# Patient Record
Sex: Female | Born: 1935 | Race: White | Hispanic: No | Marital: Married | Smoking: Never smoker
Health system: Southern US, Academic
[De-identification: ages and names within clinical notes are randomized; demographics above are authoritative.]

## PROBLEM LIST (undated history)

## (undated) DIAGNOSIS — I10 Essential (primary) hypertension: Secondary | ICD-10-CM

## (undated) DIAGNOSIS — M1990 Unspecified osteoarthritis, unspecified site: Secondary | ICD-10-CM

## (undated) DIAGNOSIS — R12 Heartburn: Secondary | ICD-10-CM

## (undated) DIAGNOSIS — E669 Obesity, unspecified: Secondary | ICD-10-CM

## (undated) DIAGNOSIS — H409 Unspecified glaucoma: Secondary | ICD-10-CM

## (undated) DIAGNOSIS — K635 Polyp of colon: Secondary | ICD-10-CM

## (undated) DIAGNOSIS — I219 Acute myocardial infarction, unspecified: Secondary | ICD-10-CM

## (undated) DIAGNOSIS — K8020 Calculus of gallbladder without cholecystitis without obstruction: Secondary | ICD-10-CM

## (undated) DIAGNOSIS — E119 Type 2 diabetes mellitus without complications: Secondary | ICD-10-CM

## (undated) DIAGNOSIS — H9190 Unspecified hearing loss, unspecified ear: Secondary | ICD-10-CM

## (undated) DIAGNOSIS — E785 Hyperlipidemia, unspecified: Secondary | ICD-10-CM

## (undated) DIAGNOSIS — I2510 Atherosclerotic heart disease of native coronary artery without angina pectoris: Secondary | ICD-10-CM

## (undated) DIAGNOSIS — R5383 Other fatigue: Secondary | ICD-10-CM

## (undated) HISTORY — PX: HX CATARACT REMOVAL: SHX102

## (undated) HISTORY — PX: HX ANKLE FRACTURE TX: SHX122

## (undated) HISTORY — PX: HX TONSILLECTOMY: SHX27

## (undated) HISTORY — PX: HX ADENOIDECTOMY: SHX29

## (undated) HISTORY — PX: HX HYSTERECTOMY: SHX81

## (undated) HISTORY — PX: HX COLECTOMY: SHX59

## (undated) HISTORY — PX: HX CARPAL TUNNEL RELEASE: SHX101

## (undated) HISTORY — DX: Unspecified osteoarthritis, unspecified site: M19.90

## (undated) HISTORY — PX: SMALL INTESTINE SURGERY: SHX150

## (undated) HISTORY — DX: Acute myocardial infarction, unspecified: I21.9

## (undated) HISTORY — PX: OTHER SURGICAL HISTORY: SHX169

## (undated) HISTORY — DX: Unspecified hearing loss, unspecified ear: H91.90

## (undated) HISTORY — DX: Unspecified glaucoma: H40.9

## (undated) HISTORY — PX: ABDOMINAL HYSTERECTOMY: SHX81

## (undated) HISTORY — PX: TONSILLECTOMY: SUR1361

## (undated) HISTORY — DX: Calculus of gallbladder without cholecystitis without obstruction: K80.20

## (undated) HISTORY — DX: Other fatigue: R53.83

## (undated) HISTORY — PX: CATARACT EXTRACTION, BILATERAL: SHX1313

## (undated) HISTORY — PX: ANGIOPLASTY: SHX39

## (undated) HISTORY — PX: COLONOSCOPY: SHX174

## (undated) HISTORY — PX: ANKLE SURGERY: SHX546

## (undated) HISTORY — DX: Atherosclerotic heart disease of native coronary artery without angina pectoris: I25.10

## (undated) HISTORY — PX: VAGINAL PROLAPSE REPAIR: SHX830

---
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
## 1957-07-09 DIAGNOSIS — Z9289 Personal history of other medical treatment: Secondary | ICD-10-CM

## 1957-07-09 HISTORY — DX: Personal history of other medical treatment: Z92.89

## 2010-07-09 DIAGNOSIS — I219 Acute myocardial infarction, unspecified: Secondary | ICD-10-CM

## 2010-07-09 HISTORY — DX: Acute myocardial infarction, unspecified: I21.9

## 2010-08-18 ENCOUNTER — Ambulatory Visit (INDEPENDENT_AMBULATORY_CARE_PROVIDER_SITE_OTHER): Payer: Medicare Other | Admitting: Hospital-Specialty Hospital

## 2010-08-18 ENCOUNTER — Ambulatory Visit
Admission: RE | Admit: 2010-08-18 | Discharge: 2010-08-18 | Disposition: A | Discharge status: Home / Self Care | Payer: Medicare Other | Source: Ambulatory Visit | Attending: Hospital-Specialty Hospital | Admitting: Hospital-Specialty Hospital

## 2010-08-18 VITALS — BP 124/72

## 2010-08-18 DIAGNOSIS — N8110 Cystocele, unspecified: Secondary | ICD-10-CM | POA: Insufficient documentation

## 2010-08-18 DIAGNOSIS — N3946 Mixed incontinence: Secondary | ICD-10-CM | POA: Insufficient documentation

## 2010-08-18 DIAGNOSIS — I10 Essential (primary) hypertension: Secondary | ICD-10-CM | POA: Insufficient documentation

## 2010-08-18 NOTE — Progress Notes (Signed)
Do not send a copy of this letter to the referring provider.   Copy on departmental stationary sent.     August 18, 2010    Silvio Clayman, MD  [address]  [place], [place]    RE: [name]  DOB: 12-23-1918    Dear Dr. Lorin Picket:           Thank you for referring [name]. She is a very pleasant, 77 year old who presented with vaginal prolapse and urinary incontinence.  Speculum examination revealed that her anterior vagina, apex, and upper half of the posterior wall prolapsed past the hymen with Valsalva. Cough stress test is positive in the supine position without bladder filling and with the prolapse reduced. Q-tip test demonstrated a maximum straining angle of 40 degrees.         My impression is that [name] has severe vaginal prolapse and urinary incontinence. I discussed my findings and treatment options in detail with [name]. She feels that her condition is severe enough to warrant surgical correction.  My plan is to schedule [name] for anterior, posterior and enterocele repair; uterosacral ligament vaginal vault suspension, vaginectomy, and placement of tension-free vaginal tape in the near future.        Thank you for asking me to see this very pleasant lady. I will keep you informed of her progress.  If I can be of any further assistance with urogynecologic problems, please do not hesitate to call.       Respectfully,         Liz Beach, MD

## 2010-08-18 NOTE — Progress Notes (Signed)
Do not send a copy of this office note to the referring provider.     [facility] of Medicine   Department of Obstetrics & Gynecology   Division of Urogynecology & Reconstructive Pelvic Surgery     History & Physical     Name: [name]  MRN: [id number]   DOB: 15-May-1919   Date: 08/18/2010     Referring Provider:   America Brown, MD      Ms. Andujo is a 77 y.o. WWF, P8, referred with vaginal prolapse and urinary incontinence, for consultation.     Has a protrusion outside the vaginal introitus for many years.  Gradually increased in size.  Frequently has a 10-cm protrusion outside the vaginal introitus.  Causes pressure, discomfort, and difficulty emptying her bladder.  Has tried several types of pessary without success.    Has occasional urine loss with strenuous activities.   Uses several pads daily for protection.    Voids 8x every day and 2-3x every night.   Has frequent urinary urgency and urge incontinence.   Does not have enuresis.  Does not have to splint to complete a bowel movement.    Not interested in retaining vaginal function.      PMH:  1.  HTN.  2.  Glaucoma.  Able to perform >4 METs with no clinical risk factor.    PSH:  1.  T&A.  2.  Colon resection.  3.  TVH and anterior repair.  4.  Ankle surgery.  5.  Carpal tunnel surgery.    Medications:  1.  HCTZ 12.5 mg qd.  2.  Atenolol 25 mg qd.  3.  ASA 81 mg qd.  4.  Tylenol bid.  5.  Lumigan 0.03% qd.  6.  Dorzolamide 2% tid.    No known medical, latex, iodine, or adhesive tape allergy.    Family History:  No FH of breast, uterine, colon, or ovarian cancer.   No FH of DVT or thrombotic diseases.    Social History:  No hx of cervical dysplasia.   Occ etoh.  Lifetime nonsmoker.      ROS: Review of 12-organ system is noncontributory except as noted above.       PE - healthy female.   BP-124/72, wt-145, ht-64 ins.   Skin has no rash or lesion.   Neck - supple with no thyromegaly.    Abdomen - soft. A low vertical incision is present. There is no mass, tenderness, incisional or inguinal hernia.   Oriented x3.   Appropriate mood and affect.   No CVAT.   Spine and lower extremities have normal ROM.   No palpable supraclavicular or groin nodes.   Speculum - normal external female genitalia.   Thickened mucosa.  Cough stress test is positive in the supine position without bladder filling and with the prolapse reduced..   Q-tip test demonstrated a maximum straining angle of 50 degree.     POPQ: 4,0,9,8,1,1,-9,1,Y/N.    Dipstix of a clean catch urine showed small blood and moderate LE.   A specimen is sent for c&s.       Assessment:   1. Stage V-c vaginal prolapse,   2. Mixed urinary incontinence.  Has higher surgical risk because of her age.    Plan: findings, treatment options, advantages & disadvantages of each discussed in detail.   : Ms. Touchton would like to discuss treatment options with family before making a final decision.  : Follow-up prn.  :  Call with any problem.       Milyn Stapleton HM Benay Pillow, MD

## 2010-08-20 LAB — URINE CULTURE

## 2010-09-01 ENCOUNTER — Encounter (HOSPITAL_COMMUNITY): Payer: Self-pay

## 2010-09-01 ENCOUNTER — Ambulatory Visit: Payer: Medicare Other | Attending: Hospital-Specialty Hospital | Admitting: Hospital-Specialty Hospital

## 2010-09-01 ENCOUNTER — Ambulatory Visit
Admission: RE | Admit: 2010-09-01 | Discharge: 2010-09-01 | Disposition: A | Discharge status: Home / Self Care | Payer: Medicare Other | Source: Ambulatory Visit | Attending: Hospital-Specialty Hospital | Admitting: Hospital-Specialty Hospital

## 2010-09-01 DIAGNOSIS — R32 Unspecified urinary incontinence: Secondary | ICD-10-CM | POA: Insufficient documentation

## 2010-09-01 DIAGNOSIS — N8110 Cystocele, unspecified: Secondary | ICD-10-CM | POA: Insufficient documentation

## 2010-09-01 DIAGNOSIS — Z01818 Encounter for other preprocedural examination: Secondary | ICD-10-CM | POA: Insufficient documentation

## 2010-09-01 DIAGNOSIS — I82409 Acute embolism and thrombosis of unspecified deep veins of unspecified lower extremity: Secondary | ICD-10-CM | POA: Insufficient documentation

## 2010-09-01 DIAGNOSIS — Z7901 Long term (current) use of anticoagulants: Secondary | ICD-10-CM | POA: Insufficient documentation

## 2010-09-01 HISTORY — DX: Essential (primary) hypertension: I10

## 2010-09-01 HISTORY — DX: Unspecified osteoarthritis, unspecified site: M19.90

## 2010-09-01 HISTORY — DX: Polyp of colon: K63.5

## 2010-09-01 HISTORY — DX: Heartburn: R12

## 2010-09-01 HISTORY — DX: Obesity, unspecified: E66.9

## 2010-09-01 LAB — BASIC METABOLIC PANEL
ANION GAP: 11 mmol/L (ref 5–16)
BUN/CREAT RATIO: 22 (ref 6–22)
BUN: 17 mg/dL (ref 6–20)
CALCIUM: 10.3 mg/dL (ref 8.5–10.4)
CARBON DIOXIDE: 27 mmol/L (ref 22–32)
CHLORIDE: 98 mmol/L (ref 96–111)
CREATININE: 0.78 mg/dL (ref 0.49–1.10)
ESTIMATED GLOMERULAR FILTRATION RATE: >59 ml/min/1.73m2 (ref >59)
GLUCOSE,NONFAST: 101 mg/dL (ref 65–139)
POTASSIUM: 3.9 mmol/L (ref 3.5–5.1)
SODIUM: 136 mmol/L (ref 136–145)

## 2010-09-01 LAB — PERFORM POC WHOLE BLOOD GLUCOSE: GLUCOSE, POINT OF CARE: 130 mg/dL — ABNORMAL HIGH (ref 70–105)

## 2010-09-01 LAB — CBC
HCT: 41.7 % (ref 33.5–45.2)
HGB: 15 g/dL (ref 11.5–15.2)
MCH: 32.9 pg (ref 27.4–33.0)
MCHC: 36 g/dL — ABNORMAL HIGH (ref 31.6–35.5)
MCV: 91.4 fL (ref 82.0–99.0)
MPV: 7.6 FL (ref 7.4–10.4)
PLATELET COUNT: 266 THOU/uL (ref 140–450)
RBC: 4.56 MIL/uL (ref 3.84–5.04)
RDW: 11.8 % (ref 10.2–14.0)
WBC: 8.2 THOU/uL (ref 3.5–11.0)

## 2010-09-01 LAB — PT/INR
INR: 1 (ref 0.8–1.2)
PROTHROMBIN TIME: 10.3 s (ref 9.1–11.5)

## 2010-09-01 NOTE — Progress Notes (Signed)
[facility] of Medicine   Department of Obstetrics & Gynecology   Division of Urogynecology & Reconstructive Pelvic Surgery    History & Physical     Name: [name]   MRN: [id number]   DOB: 1918/07/17   Date: 09/01/2010     [name] is a 77 y.o. WWF, P8, with symptomatic vaginal prolapse and urinary incontinence, scheduled for surgical repair.  Not interested in retaining vaginal function.   Has to use a walker recently because of back pain.    PMH:   1. HTN.   2. Glaucoma.   Able to perform >4 METs with no clinical risk factor.     PSH:   1. T&A.   2. Colon resection.   3. TVH and anterior repair.   4. Ankle surgery.   5. Carpal tunnel surgery.     Medications:   1. HCTZ 12.5 mg qd.   2. Atenolol 25 mg qd.   3. ASA 81 mg qd.   4. Tylenol bid.   5. Lumigan 0.03% qd.   6. Dorzolamide 2% tid.     No known medical, latex, iodine, or adhesive tape allergy.    ROS: Lifetime nonsmoker.       PE - healthy female.   BP-140/82, wt-145, ht-64 ins.   Lungs - clear.  CVS - RRR.  Abdomen - soft. A low vertical incision is present. There is no mass, tenderness, incisional or inguinal hernia.   Spine and lower extremities have normal ROM.   Speculum - normal external female genitalia.   Thickened mucosa.   Cough stress test is positive in the supine position without bladder filling and with the prolapse reduced..   Q-tip test demonstrated a maximum straining angle of 50 degree.     POPQ: 4,0,9,8,1,1,-9,1,Y/N.      Assessment: symptomatic vaginal prolapse and stress urinary incontinence.    Plan: findings, treatment options, advantages & disadvantages of each discussed in detail.   : Ms. Charrier feels that her condition is severe enough to warrant surgical correction.  : Anterior, posterior and enterocele repair; uterosacral ligament vaginal vault suspension, placement of TVT, vaginectomy, and cystoscopy.   : The proposed procedures, possible complications, failure rate, and elective nature of procedures explained.  : Avoid ASA and NSAIDs for two weeks prior to surgery.      Leigh Blas HM Benay Pillow, MD

## 2010-09-01 NOTE — Progress Notes (Signed)
[facility]   of Medicine   Department of Obstetrics & Gynecology   Division of Urogynecology & Reconstructive Pelvic Surgery    Pre-operative Risk Assessment     Name: [name]  MRN: [id number]  DOB: [birth year]  Date: 09/01/2010      A. Major cardiac risk factors that require pre-op medical evaluation:     1. Unstable or severe angina. Easily fatiguing with exertion. Achiness in           shoulder, neck, and jaw. Heaviness in left or right arm. No.    2. Decompensated CHF - New York Heart Association class IV. No.      Dyspnea, orthopnea, fatigue, pedal edema.    3. Myocardial infarction less than one month ago. No.    4. Uncontrolled/unstable arrhythmias:   a. High grade atrioventricular block. No.   b. Symptomatic or newly diagnosed ventricular tachycardia such as Mobitz II       or third degree atrioventricular block. No.    c. Supraventricular tachycardia with heart rate >100, at rest. No.    5. Severe/high grade valvular lesions:   a. Severe aortic stenosis with mean valve area<1-cm2, mean pressure          gradient >40 mmHg, or chest pain/congestive heart failure/or light-        headedness. No.  b. Symptomatic mitral stenosis - exertional presyncope, heart failure,          progressive dyspnea on exertion. No.    B. Intermediate risk surgery (cardiac risk 1-5%) - intraperitoneal surgery   1. >4 metabolic equivalents (METs) of activity + one or less medical risk        factor - proceed with surgery.     Definition of clinical risk factors:   a. History of ischemic heart disease.   b. History of compensated or prior CHF.   c. Renal insufficiency (serum creatinine >2 mg/dL).   d. Diabetes.  e. History of cerebral vascular accident.    Definition of poor functional status - cannot perform up to 4 METs:   1. Walking on a flat surface,  2. Climbing flight of stairs,   3. Light housework without chest pain, dyspnea, or fatigue.    Definition of good functional status - >4 METs of activity:   a. Able to climbed one flight of stairs without severe fatigue.   b. Able to walk a block at a brisk pace.   c. Able to run for a short distance.   d. Able to perform heavy work around the house such as mowing       the lawn or scrubbing the floor.   e. Able to participate in sporting events.    C. Electrocardiogram - patients with peripheral vascular disease,       cerebral vascular disease, coronary artery disease, or clinical       risk factors undergoing intermediate risk surgery. No.    D. Pulmonary risk factors.  1. Breathlessness or wheezing @ rest - No.  2. Breathlessness or wheezing w/ <4 METs - No.  3. Breathlessness or wheezing w/ 4 or more METs - No.    E. Family history of deep vein thrombosis - No.     F. Risk level for deep vein thrombosis: High.  1. Low risk: 40 or less and surgery lasting less than 30 minutes.  2. Medium risk: >40, surgery of any duration, and no other clinical risk  factors.  3. High risk: >40 + risk factors.    DVT risk factors:  a. Prior deep vein thrombosis or pulmonary embolus.  b. Varicose vein.  c. Infection.  d. Malignancy.  e. Hormone replacement therapy.  f.  Obesity.  g. Prolonged surgery.    G. Endocarditis prophylaxis: No.    H. Risk for adrenal insufficiency - >20 mg of prednisone or equivalent for >5 days. No.    I. Risk for benzodiazepine withdrawal: No.      Liz Beach, MD

## 2010-09-01 NOTE — Progress Notes (Signed)
[facility] of Medicine   Department of Obstetrics & Gynecology   Division of Urogynecology & Reconstructive Pelvic Surgery    Preoperative Orders    Name: [name]  MRN: [id number]  DOB: 07/18/1918  Date:  09/01/2010    1.  Ancef 2 grams IVSS on call to OR.           2.  CBC, BMP, PT/INR.    3.  EKG.    4.  Lovenox 40-mg sc 2 hours pre-op.        Billey Wojciak HM Benay Pillow, MD

## 2010-09-01 NOTE — Progress Notes (Signed)
[facility] of Medicine   Department of Obstetrics & Gynecology   Division of Urogynecology & Reconstructive Pelvic Surgery     Your surgery is scheduled for March 15th.  Things for [name] to do before her surgery.     1. Do not take the following or similar product after February 27th.  A) Mobic,   B) Diclofenac,   C) Aspirin or Ecotrin,   D) Motrin or ibuprofen,   E) Advil,   F) Aleve,   G) Naproxen,  H) Celebrex.   You may take tylenol.     2. Also, do not take vitamin E, fishoil, any products from [employer] store, or herbal medicine after February 27th.    3. On the day of surgery, please bring all your medications      to the hospital with you no matter what anyone tells you.      Jheremy Boger HM Benay Pillow, MD

## 2010-09-01 NOTE — Anesthesia Preprocedure Evaluation (Addendum)
Additional History Findings:  Patient / Family anesthesia-related difficulties: No  Other: No       Airway   Mallampati: I  TM distance: >3 FB  Neck ROM: full  Mouth Opening: good.  Dental   - negative                       Physical Exam:  Lungs: CTA  Heart: RRR  Other: No    EKG: 09/01/10 done in PAT   CXR: NA  Other Studies:  09/01/10 done in PAT    Consults: Reviewed EKG with Dr. Ethelene Browns, who states she is okay to proceed with surgery.  Follow-Up: None      Anesthesia type: general    ASA 3        Patient's NPO status is appropriate for Anesthesia.      Anesthetic plan and risks discussed with patient.            The Anesthesia Plan above was formulated based on the history, results, and physical exam performed immediately prior to the procedure/surgery.    Patient instructed to take no meds on am of surgery.  Stop aspirin and vitamins for 7 days prior to surgery.

## 2010-09-05 NOTE — Progress Notes (Signed)
[facility] of Medicine   Department of Obstetrics & Gynecology   Division of Urogynecology & Reconstructive Pelvic Surgery    Preoperative Checklist    Name: [name]   MRN: [id number]   DOB: 04-17-1919   Date: 09/05/2010      H&P, labs, and EKG reviewed.  EKG showed LBBB.  Cardiology appt scheduled with [facility] [place].  No contraindication for the planned procedures.      Carolan Avedisian HM Benay Pillow, MD

## 2010-09-13 ENCOUNTER — Encounter (INDEPENDENT_AMBULATORY_CARE_PROVIDER_SITE_OTHER): Payer: Self-pay | Admitting: Interventional Cardiology

## 2010-09-13 ENCOUNTER — Ambulatory Visit (INDEPENDENT_AMBULATORY_CARE_PROVIDER_SITE_OTHER): Payer: Medicare Other | Admitting: Interventional Cardiology

## 2010-09-13 VITALS — BP 138/68 | HR 83 | Resp 16 | Ht 64.0 in | Wt 155.0 lb

## 2010-09-13 DIAGNOSIS — R0602 Shortness of breath: Secondary | ICD-10-CM

## 2010-09-13 HISTORY — DX: Shortness of breath: R06.02

## 2010-09-13 MED ORDER — ATENOLOL 50 MG TABLET
50.0000 mg | ORAL_TABLET | Freq: Every day | ORAL | Status: DC
Start: 2010-09-13 — End: 2011-09-14

## 2010-09-13 NOTE — H&P (Addendum)
History of Present Illness  [name] is a 77 y.o. female who presents with a chief complaint of Establish Care   to clinic with an abnormal preoperative ECG showing LBBB.  She has no known history of coronary artery disease or heart failure with risk factors of HTN, age, and PM status.    She is scheduled for a bladder repair, likely with general anesthesia.  The only cardiac symptom she endorses is SOB "when hurrying".  She can complete light housework such as Pharmacologist and cooking but leaves heavier housework to a helper.  No exertional chest pressure/pain/discomfort.  No edema, palpitations.  Past History  Current outpatient prescriptions   Medication Sig   . hydrochlorothiazide (MICROZIDE) 12.5 mg Oral Capsule take 12.5 mg by mouth Once a day.   Marland Kitchen atenolol (TENORMIN) 25 mg Oral Tablet take 25 mg by mouth Once a day.   . Acetaminophen (TYLENOL) 500 mg Oral Tablet take 500 mg by mouth Once a day.   . Bimatoprost (BIMATOPROST) 0.03 % Ophthalmic Drops by Both Eyes route every night.   . dorzolamide (TRUSOPT) 2 % Ophthalmic Drops 1 Drop by Left Eye route Three times a day.   . Ascorbic Acid (VITAMIN C) 1,000 mg Oral Tablet take 1,000 mg by mouth Once a day.   Marland Kitchen aspirin 81 mg Oral Tablet, Chewable take 81 mg by mouth Once a day.   . multivitamin Oral Tablet take 1 Tab by mouth Once a day.   . CA CARBONATE/VITAMIN D3/VIT K (CALCIUM CHEW ORAL) take  by mouth.   . DISCONTD: ACETAMINOPHEN ORAL take  by mouth.       Allergies   Allergen Reactions   . Phenergan (Promethazine)      Makes her "crazy"       Past Medical History   Diagnosis Date   . PONV (postoperative nausea and vomiting)    . Hypertension    . History of transfusion 1959     no reaction to blood products   . Glaucoma    . Arthritis    . Heartburn      occ. takes TUMS once weekly   . Colon polyp      Hx of   . Obesity    . SOB (shortness of breath) 09/13/2010       Past Surgical History   Procedure Date   . Hx tonsillectomy    . Hx adenoidectomy     . Hx hysterectomy    . Hx carpal tunnel release      right   . Hx colectomy      with polyp removal   . Hx ankle fracture tx      right ankle   . Hx cataract removal      both, eyes       Family History  Family History   Problem Relation Age of Onset   . Stroke Father        Social History  History   Social History   . Marital Status: Married     Spouse Name: N/A     Number of Children: N/A   . Years of Education: N/A   Social History Main Topics   . Smoking status: Never Smoker    . Smokeless tobacco: Not on file   . Alcohol Use: No   . Drug Use: No   . Sexually Active: Not on file   Other Topics Concern   . Ability To Walk 1 Flight Of  Steps Without Sob/cp No     doesn't walk stairs    . Ability To Do Own Adl's Yes     Does own housework and cooking, but does get help with heavier cleaning and heavy housework   . Uses Walker Yes   Social History Narrative   . No narrative on file       Review of Systems  Other than ROS in the HPI, all other review of systems were negative except for: Cardiovascular: positive for dyspnea  Mild dyspnea with exertion, limited by balance and uses cane when leaving home.  No typical exertional angina.   Examination  BP 138/68   Pulse 83   Resp 16   Ht 1.626 m (5\' 4" )   Wt 70.308 kg (155 lb)   BMI 26.61 kg/m2   SpO2 94%  General: appears younger than stated age - accompanied by son  Eyes: Conjunctiva clear., Sclera non-icteric.   HENT:Mouth mucous membranes dry.   Neck: No JVD  Lungs: clear to auscultation bilaterally.   Cardiovascular:    Heart regular rate and rhythm, 1/6 soft systolic murmur heard best at LLSB    Vascular No carotid bruits    Chest wall NormalAbdomen: soft, non-tender  Extremities: no cyanosis or edema  Skin: Skin warm and dry and No rashes  Neurologic: grossly normal  Psychiatric: AOx3 and normal    Diagnosis and Plan  1. LBBB (left bundle branch block) (426.28F)    2. SOB (shortness of breath) (786.05T)    3. HTN (hypertension) (401.9AF)       "New" LBBB, unknown chronicity.  Her preoperative risk assessment deems her a moderate to high cardiac risk for her upcoming bladder surgery based on age, abnormal ECG, symptoms, and high likelihood of underlying CAD.   Discussed risk of not evaluating with preoperative stress test to further evaluate LBBB, but  will have TTE completed asap to evaluate systolic function.   The risk of not having preoperative stress test was discussed at length with the patient and  she accepts the risk of surgery.   Recommend to follow serial cardiac enzymes post operatively.   Recommend to follow serial ECGs although has baseline LBBB.   Recommend she continue her BB in the perioperative period.  HTN BP slightly elevated - Increase atenolol to 50 mg daily.  RTC depending on TTE results.  No orders of the defined types were placed in this encounter.       Placido Sou, PA-C 09/13/2010, 12:37 PM  Barron Schmid, PA-C  Physician Assistant - Certified  [facility]    Ricky Ala, M.D.  Assistant Professor of Medicine  [employer]        I have seen and evaluated the patient jointly with Barron Schmid PA-C and agree with her assessment, recommendations and plans.    Marny Lowenstein, MD         ADDENDUM     The patient had a TTE in [place] yesterday.  I personally read this study.  There was mild (probably ischemic) cardiomyopathy present.  The EF was around 40%.  The septum was akinetic and the apex appeared hypokinetic in some views.  The anterior, lateral, and inferior wall appeared to function normal.  There was mild diastolic dysfunction.  There was no evidence of significant valvular heart disease.  The right ventricular systolic pressure was 35 mmHg which is at the upper limits of normal.  I do not feel that this significantly impacts  the above recommendations.  She would still be considered at moderate to high risk of cardiac complications for a fairly low risk procedure.  She would have a 5-10% chance of developing angina, CHF, or MI and a 2-5% chance of death with a procedure involving general anesthesia.  These risks were discussed with her at length during the clinic visit.  She wishes to proceed with her surgery as scheduled.  She should be monitored in the hospital on telemetry overnight after the procedure and serial ECG and cardiac biomarkers and a repeat TTE should be done if she has hypotension, chest pain, dyspnea, or arrhythmia.  A cardiology consultation should be obtained for any objective evidence of ischemia.  Cardiac catheterization could be pursued when appropriate post-operatively if MI or worsening symptoms develop.        Marny Lowenstein, MD

## 2010-09-14 NOTE — Progress Notes (Addendum)
Addended by: Ricky Ala A on: 09/14/2010      Modules accepted: Level of Service

## 2010-09-15 MED ORDER — CEFAZOLIN 10 GRAM SOLUTION FOR INJECTION
2.00 g | Freq: Once | INTRAMUSCULAR | Status: DC
Start: 2010-09-18 — End: 2010-09-18
  Filled 2010-09-15: qty 20

## 2010-09-15 MED ORDER — DEXTROSE 5 % IN WATER (D5W) INTRAVENOUS SOLUTION
2.00 g | Freq: Once | INTRAVENOUS | Status: DC
Start: 2010-09-18 — End: 2010-09-18
  Filled 2010-09-15: qty 20

## 2010-09-18 ENCOUNTER — Inpatient Hospital Stay (HOSPITAL_COMMUNITY): Payer: Medicare Other

## 2010-09-18 ENCOUNTER — Encounter (HOSPITAL_COMMUNITY)
Admission: RE | Disposition: A | Discharge status: Home / Self Care | Payer: Self-pay | Source: Ambulatory Visit | Attending: Hospital-Specialty Hospital

## 2010-09-18 ENCOUNTER — Inpatient Hospital Stay (HOSPITAL_COMMUNITY): Payer: Medicare Other | Admitting: Anesthesiology-BA

## 2010-09-18 ENCOUNTER — Other Ambulatory Visit (HOSPITAL_COMMUNITY): Payer: Self-pay | Admitting: Hospital-Specialty Hospital

## 2010-09-18 ENCOUNTER — Inpatient Hospital Stay (HOSPITAL_COMMUNITY): Payer: Medicare Other | Admitting: Hospital-Specialty Hospital

## 2010-09-18 ENCOUNTER — Encounter (HOSPITAL_COMMUNITY): Payer: Self-pay

## 2010-09-18 ENCOUNTER — Inpatient Hospital Stay
Admission: RE | Admit: 2010-09-18 | Discharge: 2010-09-21 | DRG: 746 | Disposition: A | Discharge status: Home / Self Care | Payer: Medicare Other | Source: Ambulatory Visit | Attending: Hospital-Specialty Hospital | Admitting: Hospital-Specialty Hospital

## 2010-09-18 ENCOUNTER — Encounter (HOSPITAL_COMMUNITY): Payer: Self-pay | Admitting: NURSE PRACTITIONER

## 2010-09-18 DIAGNOSIS — J9819 Other pulmonary collapse: Secondary | ICD-10-CM | POA: Diagnosis present

## 2010-09-18 DIAGNOSIS — N393 Stress incontinence (female) (male): Secondary | ICD-10-CM | POA: Diagnosis present

## 2010-09-18 DIAGNOSIS — J90 Pleural effusion, not elsewhere classified: Secondary | ICD-10-CM | POA: Diagnosis present

## 2010-09-18 DIAGNOSIS — N815 Vaginal enterocele: Secondary | ICD-10-CM | POA: Diagnosis present

## 2010-09-18 DIAGNOSIS — I2510 Atherosclerotic heart disease of native coronary artery without angina pectoris: Secondary | ICD-10-CM | POA: Diagnosis present

## 2010-09-18 DIAGNOSIS — N993 Prolapse of vaginal vault after hysterectomy: Principal | ICD-10-CM | POA: Diagnosis present

## 2010-09-18 DIAGNOSIS — Y838 Other surgical procedures as the cause of abnormal reaction of the patient, or of later complication, without mention of misadventure at the time of the procedure: Secondary | ICD-10-CM | POA: Diagnosis present

## 2010-09-18 DIAGNOSIS — I519 Heart disease, unspecified: Secondary | ICD-10-CM | POA: Diagnosis present

## 2010-09-18 DIAGNOSIS — I447 Left bundle-branch block, unspecified: Secondary | ICD-10-CM | POA: Diagnosis present

## 2010-09-18 DIAGNOSIS — I214 Non-ST elevation (NSTEMI) myocardial infarction: Secondary | ICD-10-CM | POA: Diagnosis present

## 2010-09-18 DIAGNOSIS — I10 Essential (primary) hypertension: Secondary | ICD-10-CM | POA: Diagnosis present

## 2010-09-18 DIAGNOSIS — I2589 Other forms of chronic ischemic heart disease: Secondary | ICD-10-CM | POA: Diagnosis present

## 2010-09-18 HISTORY — PX: HX BLADDER SUSPENSION: SHX72

## 2010-09-18 LAB — CBC/DIFF
BASOPHILS: 1 % (ref 0–1)
BASOS ABS: 0.078 THOU/uL (ref 0.0–0.2)
EOS ABS: 0.017 THOU/uL — ABNORMAL LOW (ref 0.1–0.3)
EOSINOPHIL: 0 % — ABNORMAL LOW (ref 1–6)
HCT: 32.3 % — ABNORMAL LOW (ref 33.5–45.2)
HGB: 11.5 g/dL (ref 11.5–15.2)
LYMPHS ABS: 0.992 THOU/uL — ABNORMAL LOW (ref 1.0–4.8)
MCH: 32.8 pg (ref 27.4–33.0)
MCHC: 35.7 g/dL — ABNORMAL HIGH (ref 31.6–35.5)
MCV: 91.8 fL (ref 82.0–99.0)
MONOS ABS: 0.662 THOU/uL (ref 0.1–0.9)
MPV: 7.2 FL — ABNORMAL LOW (ref 7.4–10.4)
NRBC'S: 0 /100{WBCs}
PLATELET COUNT: 234 THOU/uL (ref 140–450)
PMN'S: 85 % — ABNORMAL HIGH (ref 40–75)
RBC: 3.52 MIL/uL — ABNORMAL LOW (ref 3.84–5.04)
RDW: 11.5 % (ref 10.2–14.0)
WBC: 12 THOU/uL — ABNORMAL HIGH (ref 3.5–11.0)

## 2010-09-18 LAB — H & H
HCT: 33.1 % — ABNORMAL LOW (ref 33.5–45.2)
HGB: 11.7 g/dL (ref 11.5–15.2)

## 2010-09-18 LAB — TROPONIN-I: TROPONIN-I: <0.01 ng/mL (ref <0.030)

## 2010-09-18 SURGERY — REPAIR ANTERIOR AND POSTERIOR
Anesthesia: General | Wound class: Clean Contaminated Wounds-The respiratory, GI, Genital, or urinary

## 2010-09-18 MED ORDER — GLYCOPYRROLATE 0.2 MG/ML INJECTION SOLUTION
INTRAMUSCULAR | Status: AC
Start: 2010-09-18 — End: 2010-09-18
  Filled 2010-09-18: qty 1

## 2010-09-18 MED ORDER — HYDROMORPHONE (PF) 1 MG/ML INJECTION SOLUTION
0.2000 mg | INTRAMUSCULAR | Status: DC | PRN
Start: 2010-09-18 — End: 2010-09-18

## 2010-09-18 MED ORDER — LIDOCAINE (PF) 100 MG/5 ML (2 %) INTRAVENOUS SYRINGE
INJECTION | INTRAVENOUS | Status: AC
Start: 2010-09-18 — End: 2010-09-18
  Filled 2010-09-18: qty 5

## 2010-09-18 MED ORDER — ENOXAPARIN 40 MG/0.4 ML SUBCUTANEOUS SYRINGE
40.00 mg | INJECTION | SUBCUTANEOUS | Status: DC
Start: 2010-09-19 — End: 2010-09-19
  Filled 2010-09-18: qty 0.4

## 2010-09-18 MED ORDER — DEXTROSE 5 % AND 0.45 % SODIUM CHLORIDE INTRAVENOUS SOLUTION
INTRAVENOUS | Status: DC
Start: 2010-09-18 — End: 2010-09-19

## 2010-09-18 MED ORDER — ATROPINE 0.4 MG/ML INJECTION SOLUTION
INTRAMUSCULAR | Status: AC
Start: 2010-09-18 — End: 2010-09-18
  Filled 2010-09-18: qty 1

## 2010-09-18 MED ORDER — INDIGOTINDISULFONATE SODIUM 8 MG/ML (0.8 %) INJECTION SOLUTION
5.00 mL | Freq: Once | INTRAMUSCULAR | Status: DC | PRN
Start: 2010-09-18 — End: 2010-09-18
  Administered 2010-09-18: 0 mg via INTRAMUSCULAR
  Filled 2010-09-18: qty 5

## 2010-09-18 MED ORDER — GRANISETRON HCL 1 MG/ML (1 ML) INTRAVENOUS SOLUTION
1.0000 mg | Freq: Once | INTRAVENOUS | Status: DC | PRN
Start: 2010-09-18 — End: 2010-09-18

## 2010-09-18 MED ORDER — ENOXAPARIN 40 MG/0.4 ML SUBCUTANEOUS SYRINGE
40.00 mg | INJECTION | Freq: Once | SUBCUTANEOUS | Status: AC
Start: 2010-09-18 — End: 2010-09-18
  Administered 2010-09-18: 40 mg via SUBCUTANEOUS
  Filled 2010-09-18: qty 0.4

## 2010-09-18 MED ORDER — PROPOFOL 10 MG/ML INTRAVENOUS EMULSION
INTRAVENOUS | Status: AC
Start: 2010-09-18 — End: 2010-09-18
  Filled 2010-09-18: qty 20

## 2010-09-18 MED ORDER — HYDROMORPHONE 50 MG/50 ML IN 0.9 % SODIUM CHLORIDE INJECTION
1.00 | INTRAMUSCULAR | Status: DC | PRN
Start: 2010-09-18 — End: 2010-09-19
  Administered 2010-09-18: 1 via INTRAVENOUS

## 2010-09-18 MED ORDER — NITROGLYCERIN 0.4 MG SUBLINGUAL TABLET
0.4000 mg | SUBLINGUAL_TABLET | SUBLINGUAL | Status: DC | PRN
Start: 2010-09-18 — End: 2010-09-21
  Administered 2010-09-19 – 2010-09-20 (×5): 0.4 mg via SUBLINGUAL
  Filled 2010-09-18 (×2): qty 1

## 2010-09-18 MED ORDER — ROCURONIUM 10 MG/ML INTRAVENOUS SOLUTION
INTRAVENOUS | Status: AC
Start: 2010-09-18 — End: 2010-09-18
  Filled 2010-09-18: qty 5

## 2010-09-18 MED ORDER — FENTANYL (PF) 50 MCG/ML INJECTION SOLUTION
INTRAMUSCULAR | Status: AC
Start: 2010-09-18 — End: 2010-09-18
  Filled 2010-09-18: qty 2

## 2010-09-18 MED ORDER — SODIUM CHLORIDE 0.9 % (FLUSH) INJECTION SYRINGE
2.00 mL | INJECTION | INTRAMUSCULAR | Status: DC | PRN
Start: 2010-09-18 — End: 2010-09-18

## 2010-09-18 MED ORDER — SODIUM CHLORIDE 0.9 % (FLUSH) INJECTION SYRINGE
2.00 mL | INJECTION | Freq: Three times a day (TID) | INTRAMUSCULAR | Status: DC
Start: 2010-09-18 — End: 2010-09-18

## 2010-09-18 MED ORDER — DIPHENHYDRAMINE 50 MG/ML INJECTION SOLUTION
12.50 mg | Freq: Four times a day (QID) | INTRAMUSCULAR | Status: DC | PRN
Start: 2010-09-18 — End: 2010-09-19

## 2010-09-18 MED ORDER — HYDROMORPHONE 50 MG/50 ML IN 0.9 % SODIUM CHLORIDE INJECTION
INTRAMUSCULAR | Status: AC
Start: 2010-09-18 — End: 2010-09-18
  Filled 2010-09-18: qty 1

## 2010-09-18 MED ORDER — SODIUM CHLORIDE 0.9 % (FLUSH) INJECTION SYRINGE
2.00 mL | INJECTION | Freq: Three times a day (TID) | INTRAMUSCULAR | Status: DC
Start: 2010-09-18 — End: 2010-09-21
  Administered 2010-09-18: 0 mL
  Administered 2010-09-19 (×2): 2 mL
  Administered 2010-09-19: 0 mL
  Administered 2010-09-20 (×3): 2 mL
  Administered 2010-09-20: 0 mL
  Administered 2010-09-21: 2 mL

## 2010-09-18 MED ORDER — ATENOLOL 25 MG TABLET
25.0000 mg | ORAL_TABLET | Freq: Every day | ORAL | Status: DC
Start: 2010-09-18 — End: 2010-09-21
  Administered 2010-09-18 – 2010-09-21 (×4): 25 mg via ORAL
  Filled 2010-09-18 (×4): qty 1

## 2010-09-18 MED ORDER — SUCCINYLCHOLINE CHLORIDE 20 MG/ML INJECTION SOLUTION
INTRAMUSCULAR | Status: AC
Start: 2010-09-18 — End: 2010-09-18
  Filled 2010-09-18: qty 10

## 2010-09-18 MED ORDER — HYDROMORPHONE (PF) 1 MG/ML INJECTION SOLUTION
INTRAMUSCULAR | Status: AC
Start: 2010-09-18 — End: 2010-09-18
  Filled 2010-09-18: qty 1

## 2010-09-18 MED ORDER — WATER FOR IRRIGATION, STERILE SOLUTION
2000.00 mL | Status: DC | PRN
Start: 2010-09-18 — End: 2010-09-18
  Administered 2010-09-18: 2000 mL
  Filled 2010-09-18: qty 2000

## 2010-09-18 MED ORDER — ONDANSETRON HCL (PF) 4 MG/2 ML INJECTION SOLUTION
INTRAMUSCULAR | Status: AC
Start: 2010-09-18 — End: 2010-09-18
  Filled 2010-09-18: qty 2

## 2010-09-18 MED ORDER — ONDANSETRON HCL (PF) 4 MG/2 ML INJECTION SOLUTION
4.00 mg | Freq: Four times a day (QID) | INTRAMUSCULAR | Status: DC | PRN
Start: 2010-09-18 — End: 2010-09-21
  Administered 2010-09-18 – 2010-09-20 (×3): 4 mg via INTRAVENOUS
  Filled 2010-09-18 (×2): qty 2

## 2010-09-18 MED ORDER — SODIUM CHLORIDE 0.9 % (FLUSH) INJECTION SYRINGE
2.00 mL | INJECTION | INTRAMUSCULAR | Status: DC | PRN
Start: 2010-09-18 — End: 2010-09-21

## 2010-09-18 MED ORDER — EPHEDRINE SULFATE 50 MG/ML INJECTION SOLUTION
INTRAMUSCULAR | Status: AC
Start: 2010-09-18 — End: 2010-09-18
  Filled 2010-09-18: qty 1

## 2010-09-18 MED ORDER — CONJUGATED ESTROGENS 0.625 MG/GRAM VAGINAL CREAM
0.50 g | TOPICAL_CREAM | Freq: Once | VAGINAL | Status: DC | PRN
Start: 2010-09-18 — End: 2010-09-18
  Administered 2010-09-18: 0 g via TOPICAL
  Filled 2010-09-18: qty 42.5

## 2010-09-18 MED ADMIN — sodium chloride 0.9 %, bacteriostatic injection solution: 60 mL | INTRAMUSCULAR | NDC 00409196604

## 2010-09-18 MED ADMIN — sodium chloride 0.9 % irrigation solution: 1000 mL | NDC 00338004804

## 2010-09-18 MED FILL — sodium chloride 0.9 %, bacteriostatic injection solution: 100.0000 mL | INTRAMUSCULAR | Qty: 100 | Status: AC

## 2010-09-18 MED FILL — sodium chloride 0.9 % irrigation solution: 1000.0000 mL | Qty: 1000 | Status: AC

## 2010-09-18 SURGICAL SUPPLY — 61 items
BLADE 15 BD CNV SS SURG TISS STRL LF  DISP (SURGICAL CUTTING SUPPLIES) ×1 IMPLANT
BLADE 15 BD KNF SS SURG TISS S_TRL LF (CUTTING ELEMENTS) ×1
BLANKET 3M BAIR HUG ADLT UPR B ODY 74X24IN PLMR 2 INCS ADH (MISCELLANEOUS PT CARE ITEMS) ×1 IMPLANT
CATH URETH DOVER RBNL 14FR 16IN Ã¡STGR DRAIN EYE RND CLS TIP INT FNL CONN PVC STRL LF  DISP (UROLOGICAL SUPPLIES) ×1 IMPLANT
CATH URETH LBRCTH 16FR FL 2W B_AL LUB SIL DDRGL 5CC STRL LTX (UROLOGICAL SUPPLIES) ×1
CATH URETH LUBRICATH 16FR FOLEY 2W BAL LUB SIL DDRGL 5CC STRL LTX DISP (UROLOGICAL SUPPLIES) ×1 IMPLANT
CONV USE ITEM 156524 - ADHESIVE TISSUE EXOFIN 1.0ML_PREMIERPRO EXOFIN (SEALANTS) ×1 IMPLANT
COUNTER 40 CNT MAG DVN BOXLOCKS STRL LF  BLK SHARP 1840 PLASTIC FOAM XL DISP (NEEDLES & SYRINGE SUPPLIES) ×1 IMPLANT
DCNTR FLUID 9.2IN SET CLAMP STR TUBE N-PYRG DEHP STRL (IV TUBING & ACCESSORIES) ×1 IMPLANT
DEVICE SECURE FL CATH ANCH LON_G WR TIME ADH PVC FM LF (UROLOGICAL SUPPLIES) ×1
DEVICE SECURE FOLEY CATH ANCH LONG WR TIME ADH PVC FOAM LTX (UROLOGICAL SUPPLIES) ×1 IMPLANT
DEVICE TVT 810041B 1/BX (DEVICE) ×1 IMPLANT
DISC USE 162466 - BAG DRAIN UROLOGY 2000ML LF_154003 20EA/CS (UROLOGICAL SUPPLIES) ×1 IMPLANT
DISCONTINUED USE ITEM 319478 - SUTURE 3-0 CT2 COAT VICRYL 27IN VIOL BRD ABS (SUTURE/WOUND CLOSURE) IMPLANT
DONUT EXTREMITY CUSHIONING 31143137 (POSITIONING PRODUCTS) ×3 IMPLANT
DRAPE FL CNTRL PCH DRAIN PORT FILTER SCRN 38X15IN UNDR BUTT CNVRT LF  STRL DISP SURG 27IN (PROTECTIVE PRODUCTS/GARMENTS) ×1 IMPLANT
DRAPE FL CNTRL PCH DRAIN PORT_FILTER SCRN 38X15IN UNDR BUTT (PROTECTIVE PRODUCTS/GARMENTS) ×1
DUPE USE ITEM 319434 - SUTURE 2-0 SH VICRYL 18IN VIOL_CR BRD 8 STRN COAT ABS (SUTURE/WOUND CLOSURE) IMPLANT
DUPE USE ITEM 319452 - SUTURE 0 CT2 VICRYL 27IN VIOL_BRD COAT ABS (SUTURE/WOUND CLOSURE) IMPLANT
ELECTRODE ESURG BLADE PNCL 10FT VLAB STRL SS DISP BUTTON SWH HEX LOCK CORD HLSTR LF  ACPT 3/32IN STD (CAUTERY SUPPLIES) ×1 IMPLANT
HANDLE RIGID PLASTIC STRL LF  DISP DVN EZ HNDL SURG LIGHT (INSTRUMENTS) ×1
HANDPC SUCT MEDIVAC YANKAUER BLBS TIP CLR STRL LF  DISP (Suction) ×1 IMPLANT
HOOK RETRACTR LONESTAR 5MM SS SHARP ELAS STAY 8/PK STRL LF DISP (SUPP) ×1 IMPLANT
HOOK RETRACTR LONESTAR 5MM SS SHARP ELAS STAY STRL LF  DISP (SUPP) ×1
KIT RM TURNOVER CLEANOP CSTM INFCT CONTROL (KITS & TRAYS (DISPOSABLE)) ×1
KIT RM TURNOVER CLEANOP CUSTOM INFCT CONTROL (KITS & TRAYS (DISPOSABLE)) ×1 IMPLANT
MBO USE ITEM 317672 - HANDLE RIGID PLASTIC STRL LF  DISP DVN EZ HNDL SURG LIGHT (SURGICAL INSTRUMENTS) ×1 IMPLANT
NEEDLE HYPO  25GA 1.25IN STD MONOJECT AL SS REG BVL LL HUB UL SHRP ANTICORE RD STRL LF  DISP (NEEDLES & SYRINGE SUPPLIES) ×1 IMPLANT
NEEDLE SPINAL PNK 3.5IN 18GA QUINCKE REG WL POLYPROP QUINCKE TIP STRL LF  DISP (ANETHESIA SUPPLIES) ×1 IMPLANT
NEEDLE SPINAL YW 3.5IN 20GA QUINCKE REG WL POLYPROP STRL LF  DISP (ANETHESIA SUPPLIES) IMPLANT
PACK GYN/PERI 9165 9165 (DRAPE/PACKS/SHEETS/OR TOWEL) ×1 IMPLANT
PACKING VAGINAL 6IN X 2IN 10-026 (WOUND CARE SUPPLY) ×1 IMPLANT
PAD ARMBOARD FOAM BLU_FP-ECARM (POSITIONING PRODUCTS) ×2
PAD ARMBRD BLU (POSITIONING PRODUCTS) ×2 IMPLANT
PEN SURG MRKNG WRITESITE + RLR LBL SET 3X DARKER FORMULATE GNTN VIOL INK STRL LF  CHLRPRP (MISCELLANEOUS PT CARE ITEMS) ×1 IMPLANT
PEN SURG MRKNG WRITESITE + SKN_RLR LBL SET 3X DARKER (MISCELLANEOUS PT CARE ITEMS) ×1
RING RETRACTR LONESTAR NORYL PLASTIC SLF RTN 2 CATH CLIP DISP STRL LF  32.5X18.3CM (SURGICAL INSTRUMENTS) ×1 IMPLANT
RING RETRACTR SM LONESTAR NORYL PLASTIC 2 PEEL PCH SLF RTN FLXB LTWT DISP STRL LF  14.1X14.1CM (SURGICAL INSTRUMENTS) ×1 IMPLANT
SET 81IN REG CLAMP N-PYRG IRRG 10 GTT/ML STR CSCP DEHP BLADDER STRL LF (IV TUBING & ACCESSORIES) ×1 IMPLANT
SOL IV 0.9% NACL 100ML PH 5 PLASTIC CONTAINR PRSV FR VIAFLEX (SOLUTIONS) ×1 IMPLANT
SPONGE LAP 18X4IN STRL (WOUND CARE SUPPLY) ×1 IMPLANT
SPONGE SURG 4X4IN 16 PLY RADOPQ BAND VISTEC STRL LF  BLU WHT (WOUND CARE SUPPLY) ×4 IMPLANT
SUTURE 0 CT2 VICRYL 18IN VIOL CR BRD 8 STRN COAT ABS (SUTURE/WOUND CLOSURE) IMPLANT
SUTURE 0 CT2 VICRYL 18IN VIOL_CR BRD 8 STRN COAT ABS (SUTURE/WOUND CLOSURE)
SUTURE 1 CT1 PDS2 27IN VIOL MONOF ABS (SUTURE/WOUND CLOSURE) IMPLANT
SUTURE 3-0 CT2 COAT VICRYL 27IN VIOL BRD ABS (SUTURE/WOUND CLOSURE)
SUTURE 3-0 SH VICRYL 18IN VIOL CR BRD 8 STRN COAT ABS (SUTURE/WOUND CLOSURE) IMPLANT
SUTURE 3-0 SH VICRYL 27IN VIOL BRD COAT ABS (SUTURE/WOUND CLOSURE) IMPLANT
SUTURE 4-0 PS2 MONOCRYL MTPS 27IN UNDYED MONOF ABS (SUTURE/WOUND CLOSURE) IMPLANT
SUTURE 4-0 PS2 VICRYL MTPS 18IN UNDYED BRD COAT ABS (SUTURE/WOUND CLOSURE) IMPLANT
SUTURE 4-0 SH-1 VICRYL 27IN VIOL BRD COAT ABS (SUTURE/WOUND CLOSURE) IMPLANT
SUTURE CHR 0 CT1 27IN BRN MONOF ABS (SUTURE/WOUND CLOSURE) IMPLANT
SUTURE CHR 3-0 SH 27IN BRN MONOF ABS (SUTURE/WOUND CLOSURE) IMPLANT
SUTURE CHR GUT 3-0 SH 27IN BRN_MONOF ABS (SUTURE/WOUND CLOSURE)
SUTURE J327H VICRYL BX/36 3-0 27IN VIOLET (SUTURE/WOUND CLOSURE) IMPLANT
SYRINGE BULB PLASTIC 60ML CS/50 67000 (BULBS FOR MEDICAL EQUIPMENT) ×1 IMPLANT
SYRINGE LL 10ML LF  STRL CONTROL CONCEN TIP PRGN FREE DEHP-FR MED DISP (NEEDLES & SYRINGE SUPPLIES) ×1 IMPLANT
SYRINGE LL 30ML LF  STRL CONCEN TIP GRAD N-PYRG DEHP-FR MED DISP (NEEDLES & SYRINGE SUPPLIES) ×1 IMPLANT
TRAY SKIN SCRUB 8IN VNYL COTTON 6 WNG 6 SPONGE STICK 2 TIP APPL DRY STRL LF (KITS & TRAYS (DISPOSABLE)) ×1 IMPLANT
TUBING SUCT CLR 20FT 9/32IN MEDIVAC NCDTV M/M CONN STRL LF (Suction) ×1 IMPLANT
WATER STRL 2000ML PLASTIC CONTAINR UROMATIC LF (SOLUTIONS) ×1 IMPLANT

## 2010-09-18 NOTE — OR PostOp (Signed)
Hx and allergies reviewed at bedside with anesthesia.

## 2010-09-18 NOTE — Nurses Notes (Signed)
Pt. Stated she was comfortable, not in any pain. Pt wanted to get out of bed to a chair. Wylie Hail MD paged and stated it was ok to do so. Pt is resting comfortably at the bedside with her feet elevated. Will continue to monitor.

## 2010-09-18 NOTE — OR PreOp (Signed)
Admitted to [address], Area A for OR.  Alert, VSS  Daughter at bedside.

## 2010-09-18 NOTE — Student (Addendum)
[facility]  Pre-Op Note  OB/GYN Surgery    [name]  [birth year]  MRN:  [id number]    Pre-Op Diagnosis:  Vaginal Prolapse and Urinary incontinence     Procedure:  Anterior, posterior, and enterocele repair; Uterosacral ligament vaginal vault suspension, placement of TVT, vaginectomy and cystoscopy.      LABS:      CBC    Lab Results   Component Value Date/Time    WBC 8.2 09/01/10 02:47 PM    HGB 15.0 09/01/10 02:47 PM    HCT 41.7 09/01/10 02:47 PM    PLTCNT 266 09/01/10 02:47 PM    RBC 4.56 09/01/10 02:47 PM    MCV 91.4 09/01/10 02:47 PM    MCHC 36.0* 09/01/10 02:47 PM    MCH 32.9 09/01/10 02:47 PM    RDW 11.8 09/01/10 02:47 PM    MPV 7.6 09/01/10 02:47 PM          Lab Results   Component Value Date    SODIUM 136 09/01/2010    POTASSIUM 3.9 09/01/2010    CHLORIDE 98 09/01/2010    CO2 27 09/01/2010    ANIONGAP 11 09/01/2010    BUN 17 09/01/2010    CREATININE 0.78 09/01/2010    BUNCRRATIO 22 09/01/2010    GFR >59 09/01/2010    GLUCOSENF 101 09/01/2010       CXR:  Not on file  EKG: 09/01/10:  Sinus rhythm, left artrial abnormality, Left bundle branch block     Blood Type:  Not on file     Consent:  Signed and in chart    Orders:    Admit to gyn service   Vital Signs, ht, wt   NPO now   Insert and maintain peripheral IV access.    FluidsIVLR 50  ml/hr   DVT prophylaxis SCDs in OR + LMWH 40 mg subq,  pre-op   Cefazolin 2g, IV for 15 min pre-op

## 2010-09-18 NOTE — Progress Notes (Signed)
 [place]  [facility]                                                      H&P UPDATE FORM    Name: [name]   MRN: [id number]   DOB: 1919-01-21   Date of admission: 09/18/2010  Date: 09/18/2010    STOP: IF H&P IS GREATER THAN 30 DAYS FROM SURGICAL DAY COMPLETE NEW H&P IS REQUIRED.    Outpatient Pre-Surgical H & P updated the day of the procedure.  1.  H&P: done.      Change in medications: no.      Last Menstrual Period: n/a.      Comments: Does not want to retain vaginal function.    2.  Patient continues to be appropiate candidate for planned surgical procedure: yes.    Broderick Fonseca HM Areta, MD

## 2010-09-18 NOTE — OR PostOp (Signed)
 Nausea subsided after zofran  administration.  Daughter at bedside supportive.

## 2010-09-18 NOTE — Nurses Notes (Signed)
Dr. Benay Pillow in to see patient, removed oxygen.  Informed Dr. Benay Pillow that patient's sats were 91% in RA.  Preferred that her sats go no lower than 95%, if so, place back on oxygen.  Instructed to use IS and get up to chair this evening.

## 2010-09-18 NOTE — Progress Notes (Addendum)
 [place]  [facility]  Gynecology     [name],[name], 77 y.o. female  Date of Admission:  09/18/2010  Date of Service: 09/19/2010  Date of Birth:  Mar 24, 1919    Hospital Day:  LOS: 1 day     Subjective: called by RN that pt had and episode of chest pain and dropped sats to 93%. Upon my arrival pt is lying in bed comfortably with nasal cannula delivering 1 L with sats 95%. Pulse 89/min.  She confirms that she woke up with chest tightness like somebody was sitting on her chest suffocating her. She also felt nauseated with it but not SOB. She also recalls radiation to her left sided neck. She confirms that she has had similar episodes on exertion before. Never told anyone.  preop and postop EKG shows LBBB.    Vital Signs:  Temp (24hrs) Max:36.8 [name] (98.2 F)      Systolic (24hrs), Avg:140 mmHg, Min:123 mmHg, Max:164 mmHg    Diastolic (24hrs), Avg:66 mmHg, Min:58 mmHg, Max:85 mmHg    Temp  Avg: 36.5 [name] (97.7 F)  Min: 36.1 [name] (97 F)  Max: 36.8 [name] (98.2 F)  Pulse  Avg: 76.7   Min: 62   Max: 101   Resp  Avg: 19.3   Min: 16   Max: 28   SpO2  Avg: 97 %  Min: 94 %  Max: 98 %  MAP (Non-Invasive)  Avg: 93.5 mmHG  Min: 83 mmHG  Max: 104 mmHG  Pain Score: 3    Current Medications:    Current facility-administered medications:  NS 10 mL injection  2 mL Intracatheter Q8HRS   NS 10 mL injection  2-6 mL Intracatheter Q1 MIN PRN   enoxaparin  (LOVENOX ) 40 mg/0.4 mL SubQ injection  40 mg Subcutaneous Q24H   ondansetron  (ZOFRAN ) 2 mg/mL injection  4 mg Intravenous Q6H PRN   D5W 1/2 NS premix infusion   Intravenous Continuous   HYDROmorphone  (DILAUDID ) 1 mg/mL (tot vol 100 mL) in NS PCA  1 Bag Intravenous Q1H PRN   diphenhydrAMINE  (BENADRYL ) 50 mg/mL injection  12.5 mg Intravenous Q6H PRN   atenolol  (TENORMIN ) tablet  25 mg Oral Daily   nitroglycerin  (NITROSTAT ) sublingual tablet  0.4 mg Sublingual Q5 Min PRN         Today's Physical Exam:  Temperature: 36.8 [name] (98.2 F)  Heart Rate: 85   BP (Non-Invasive): 133/62  mmHg  Respiratory Rate: 18   SpO2-1: 96 %  Pain Score: 3  Gen:NAD  Lungs: clear to auscultation, no wheezes or crackles  CV: no JVD, RRR, S1+S2 no murmurs/rubs/gallops  Abdo: soft, NTTP  Ext: no edema, no tenderness to palpation, venodynes running  Skin: no rash, warm and dry  Foley: clear urine, adequate UO         I/O:  I/O last 24 hours:    Intake/Output Summary (Last 24 hours) at 09/19/10 0016  Last data filed at 09/18/10 2239   Gross per 24 hour   Intake   2870 ml   Output   1950 ml   Net    920 ml       I/O current shift:       Prophylaxis:  Date Started Date Completed   DVT/PE  Lovenox  and SCDs/ Venodynes     GI: Not indicated         Nutrition/Residuals:  Regular    Labs  (Please indicate ordered or reviewed)  Reviewed: Lab Results for Last 24 Hours:  Results for orders placed during the hospital encounter of 09/18/10 (from the past 24 hour(s))   TROPONIN-I   Component Value Range   . TROPONIN-I <0.010  <0.030 (ng/mL)   H & H   Component Value Range   . HGB 11.7  11.5 - 15.2 (g/dL)   . HCT 33.1 (*) 33.5 - 45.2 (%)   BASIC METABOLIC PANEL, NON-FASTING   Component Value Range   . SODIUM 135 (*) 136 - 145 (mmol/L)   . POTASSIUM 4.0  3.5 - 5.1 (mmol/L)   . CHLORIDE 100  96 - 111 (mmol/L)   . CARBON DIOXIDE 25  22 - 32 (mmol/L)   . ANION GAP 10  5 - 16 (mmol/L)   . CREATININE 0.84  0.49 - 1.10 (mg/dL)   . ESTIMATED GLOMERULAR FILTRATION RATE >59  >59 (ml/min/1.63m2)   . GLUCOSE,NONFAST 189 (*) 65 - 139 (mg/dL)   . BUN 13  6 - 20 (mg/dL)   . BUN/CREAT RATIO 15  6 - 22    . CALCIUM  8.7  8.5 - 10.4 (mg/dL)   CBC/DIFF   Component Value Range   . WBC 12.0 (*) 3.5 - 11.0 (THOU/uL)   . RBC 3.52 (*) 3.84 - 5.04 (MIL/uL)   . HGB 11.5  11.5 - 15.2 (g/dL)   . HCT 32.3 (*) 33.5 - 45.2 (%)   . MCV 91.8  82.0 - 99.0 (fL)   . MCH 32.8  27.4 - 33.0 (pg)   . MCHC 35.7 (*) 31.6 - 35.5 (g/dL)   . RDW 11.5  10.2 - 14.0 (%)   . PLATELET COUNT 234  140 - 450 (THOU/uL)   . MPV 7.2 (*) 7.4 - 10.4 (FL)   . PMN'S 85 (*) 40 - 75 (%)   .  PMN ABS 10.300 (*) 1.5 - 7.7 (THOU/uL)   . LYMPHOCYTES 8 (*) 20 - 45 (%)   . LYMPHS ABS 0.992 (*) 1.0 - 4.8 (THOU/uL)   . MONOCYTES 6  4 - 13 (%)   . MONOS ABS 0.662  0.1 - 0.9 (THOU/uL)   . EOSINOPHIL 0 (*) 1 - 6 (%)   . EOS ABS 0.017 (*) 0.1 - 0.3 (THOU/uL)   . BASOPHILS 1  0 - 1 (%)   . BASOS ABS 0.078  0.0 - 0.2 (THOU/uL)   . NRBC'S 0  0 (/100WBC)   MAGNESIUM    Component Value Range   . MAGNESIUM  1.7  1.7 - 2.5 (mg/dL)   PHOSPHORUS   Component Value Range   . PHOSPHORUS 2.8  2.4 - 4.7 (mg/dL)         Assessment/ Plan:   77 yo POD#0 s/p A/P, enterocele repair, TVT, vaginectomy, cysto  Acute chest pain   R/o acute coronary event    EKG- acute changes, ST depression lead I, ST elevations V1-V2  Stat labs including troponin  Stat CXR mobile  telemetry  Spoke with Dr Tonna from medicine - kindly agreed to evaluate the pt  Leonel Fruits, MD 09/17/2009, 11:28 PM    Dr Tonna called back, evaluated the pt and discussed the case with cardiology fellow. Agreed on EKG changes and suspicious history of CAD. But EKG changes difficult to ascertain since LBBB. Recommended serial trop x2 q4hrs and also to notify MD about any acute CP to obtain stat EKG.   Notified Dr Areta, aslo recommended CBC and type &screen next lab draw.  Leonel Fruits, MD 09/19/2010, 12:40 AM

## 2010-09-18 NOTE — OR PostOp (Signed)
[facility]                                                     BRIEF OPERATIVE NOTE    Name: [name]   MRN: [id number]   DOB: 07-11-1918   Date: 09/18/2010    Pre-Operative Diagnosis:   1. Symptomatic vaginal prolapse  2. Stress urinary incontinence.    Post-Operative Diagnosis: same.    Procedure(s)/Description:  1.  Anterior, posterior and enterocele repair.  2.  Placement of TVT.  3.  Vaginectomy.  4.  Cystoscopy.    Findings: see dictated op note.    Attending Surgeon: Liz Beach, MD    Assistant(s): Julian Reil, DO    Anesthesia Type: general endotracheal    Estimated Blood Loss:  600 mL.     Blood Given: none          Fluids Given: crystalloid    Complications:  none    Tubes: foley    Drains: none.           Specimens/ Cultures: vaginal mucosa           Implants: none           Disposition: Op note dictated           Condition: stable      Liz Beach, MD 09/18/2010

## 2010-09-18 NOTE — Anesthesia Postprocedure Evaluation (Signed)
ANESTHESIA POSTOP EVALUATION NOTE        Anesthesia Service      [facility]     09/18/2010      Last Vitals: Temperature: 36.5 C (97.7 F) (09/18/10 1155)  Heart Rate: 82  (09/18/10 1155)  BP (Non-Invasive): 131/60 mmHg (09/18/10 1155)  Respiratory Rate: 20  (09/18/10 1155)  SpO2-1: 97 % (09/18/10 1155)  Weight: 70.8 kg (156 lb 1.4 oz) (09/18/10 0552)  Procedure(s) Performed:  REPAIR ANTERIOR AND POSTERIOR - Lovenox on arrival; SUSPENSION VAGINAL VAULT; REPAIR ENTEROCELE; TAPING TRANSVAGINAL; CYSTOSCOPY  Patient is sufficiently recovered from the effects of anesthesia to participate in the evaluation and has returned to their pre-procedure level.  I have reviewed and evaluated the following:  Respiratory Function: Consistent with pre anesthetic level  Cardiovascular Function: Consistent with pre anesthetic level  Mental Status: Return to pre anesthetic baseline level    Comment/ re-evaluation for any variations: None    Faculty Note:  I examined the patient and agree with the above details of postoperative assessment.

## 2010-09-18 NOTE — OR PostOp (Signed)
 Received report from IVAR Holmes, RN.  Reviewed allergies and history with IVAR Holmes, RN.  No change from previous assessment.  Patient without complaints at present.

## 2010-09-18 NOTE — Progress Notes (Signed)
[facility] [employer] [facility]   [facility]  Department of Obstetrics & Gynecology   Division of Urogynecology & Reconstructive Pelvic Surgery     Inpatient Progress Note    Name: MALERIE EAKINS  MRN: [id number]   DOB: Oct 23, 1918   Date: 09/18/2010     Postoperative Check    S: No complaint. Wants to eat.    O: VS - afebrile and stable.     : Good output.     : Pain score - 0 to 3.      : O2 saturation -  97% on 2 L.     : Abdomen - soft and NT.      : Foley draining clear urine.       Assessment: stable  : BP elevated.    Plan: Routine PO care.  : Start regular diet and atenolol.        Eula Jaster HM Benay Pillow, MD

## 2010-09-18 NOTE — Nurses Notes (Signed)
Received patient to 8W.  RA sats 91%, replaced 2LNC, , sats up to 94%, will monitor.  Family at bedside, no c/o pain.  Assessment completed.

## 2010-09-18 NOTE — Nurses Notes (Signed)
Pt complaining of heaviness in her chest. O2 sat's 93% on RA, put o2 1L NC on and sat's came up to 95% pt felt more comfortable with the o2 on. Wylie Hail MD paged and stated she would be up to see the pt. Will continue to monitor.

## 2010-09-18 NOTE — OR Surgeon (Signed)
[facility]                                  DEPARTMENT OF OBSTETRICS AND GYNECOLOGY                                                OPERATION SUMMARY    PATIENT NAME: [name], [name]  HOSPITAL RUEAVW:[id number]  DATE OF SERVICE: 07/20/2010  DATE OF BIRTH: 1919-05-14    PREOPERATIVE DIAGNOSES:  1.  Symptomatic vaginal prolapse.  2.  Stress urinary incontinence.    POSTOPERATIVE DIAGNOSES: Same.    OPERATIVE PROCEDURES:    1.  Anterior, posterior and enterocele repair.  2.  Placement of tension-free vaginal tape.  3.  Vaginectomy  4.  Cystoscopy.    SURGEON:  Liz Beach, MD     ASSISTANT: Truddie Coco, DO     ESTIMATED BLOOD LOSS:  Approximately 600 mL.    FLUIDS GIVEN:  Crystalloid.    DRAINS:  Foley catheter was placed.    COMPLICATIONS:  None.    INDICATIONS FOR PROCEDURE:  [name] is a 77 year old white female with symptomatic vaginal prolapse and stress urinary incontinence.  Diagnosis, treatment options, advantages and disadvantages of each were explained.  She felt that her condition is severe enough to warrant surgical correction.  The proposed procedure, possible complications, failure rate and the elective nature of the procedure were explained. [name] is not interested in retaining vaginal function.    OPERATIVE FINDINGS:  1.  Her anterior vaginal wall and the upper part of the posterior vagina had prolapsed 8-cm past the hymen.  2.  The vaginal apex had prolapsed 10-cm past the hymen.  3.  The enterocele is distinct from the anterior and posterior vagina.  4.  There was a midline longitudinal tear in the lower portion of the pubocervical fascia.  5.  The superior edge of the pubocervical fascia had torn away from the superior edge of the rectovaginal fascia with a large enterocele.  6. There was a midline longitudinal tear in the upper portion of the rectovaginal fascia.     DESCRIPTION OF PROCEDURE:  Under satisfactory general anesthesia, the patient was placed in candy cane stirrups.  Her knees and hips were checked to avoid overflexion.  Her hips were also checked to avoid overabduction. Both her knees and hips were placed in a 90-degree angle.  Examination under anesthesia was then performed. Findings were described above.    The lower abdomen, perineum and vagina were then prepped and draped in the usual sterile manner.  A [place] retractor was placed for exposure.  The anterior vaginal wall was opened from the apex to an area approximately 1 cm below the external urethral meatus.  Underlying tissue was then dissected away from the vaginal mucosa to the pubic ramus on each side.  Because of the size of the prolapse, an extensive amount of dissection was required.  Numerous bleeding areas were encountered, which were individually coagulated or suture ligated.    The posterior vaginal wall was then opened using an inverted T-shaped incision.  Underlying tissue was then dissected away from the vaginal mucosa to the pelvic sidewall on each side.  Again, an extensive amount of dissection was required.  Bleeding areas were  individually coagulated or suture ligated.  A rectal examination revealed that the superior edge of the pubocervical fascia had torn away from the superior edge of the rectovaginal fascia with a large intervening enterocele.  There was a midline longitudinal tear in the upper portion of the rectovaginal fascia, which was repaired using 5 interrupted stitches of 2-0 Vicryl.    The midline longitudinal tear in the lower portion of the pubocervical fascia was then repaired using 5 interrupted stitches of 2-0 Vicryl.  The enterocele was next repaired by suturing the superior edge of the pubocervical fascia to the superior edge of the rectovaginal fascia using 6 interrupted stitches of 2-0 Vicryl.     A TVT procedure was next performed.  The bladder was first emptied using a red rubber catheter.  Two 1.5-cm suprapubic incisions were made in the suprapubic area.  Each incision was made just above the pubic bone and approximately 1.5 cm lateral to the midline.  The retropubic space on each side was then infiltrated with 30 mL of normal saline through each suprapubic incision.  The TVT tape was first placed on the patient's right side.  A Foley catheter and a guidewire were used to deviate the bladder and the urethra to the patient's left side.  The tip of the TVT trocar was next placed in the right periurethral area just behind the pubic bone and immediately below the right suprapubic incision.  The tip of the TVT trocar was advanced along the posterior pubis until it had penetrated the suprapubic incision.  The Foley catheter and guidewire were removed.  A cystourethroscopy was then performed with a 70-degree cystoscope.  No bladder or urethral injury was noted.  Multiple bladder diverticulum was noted. Urine was noted to flow freely from both ureteral orifices.  The bladder was next drained using a red rubber catheter.  The TVT trocar was disengaged from the handle and pulled up through the suprapubic incision along with a significant portion of the tape.   The TVT tape was next placed on the patient's left side.  A Foley catheter and guidewire were used to deviate the bladder and the urethra to the patient's right side.  The tip of the TVT trocar was next placed in the left periurethral area just behind the pubic bone and immediately below the left suprapubic incision.  The tip of the TVT trocar was advanced along the posterior pubis until it had penetrated the suprapubic incision.  The Foley catheter and guidewire were removed.  A repeat cystourethroscopy was then performed with a 70-degree cystoscope.   No bladder or urethral injury was noted.  The bladder was next drained using a red rubber catheter.  The TVT trocar was disengaged from the handle and pulled up through the suprapubic incision along with a significant portion of the tape.  The TVT tape was pulled up bilaterally until it fit loosely below the urethra with sufficient space to insert a pair of Mayo scissors.  A final cystourethroscopy did not reveal any bladder or urethral injury.  Urine was noted to flow freely from both ureteral orifices.    More than 90% of the anterior vaginal mucosa, which was redundant, was trimmed off.  Approximately 75% of the posterior vaginal mucosa, which was redundant, was trimmed off.  The rest of the mucosa was closed using a running stitch of 3-0 Vicryl.  The perineal body was rebuilt using interrupted stitches of 3-0 Vicryl.  The perineal incision was then closed subcuticularly using a  4-0 Vicryl.  Examination revealed a one-finger introitus.  A rectal examination did not reveal any injury or stitch penetration.  A Foley catheter was placed.  The procedure was then terminated at this point.  Patient was awoken from anesthesia and taken to recovery in good condition.  Estimated blood loss was approximately 600 mL.  Needle, blade and sponge count reported as correct x2.      Liz Beach, MD  Associate Professor  [facility] Department of Obstetrics and Gynecology     YQ/[id number]; D: 09/18/2010 11:47:51; T: 09/18/2010 16:19:11

## 2010-09-18 NOTE — OR PostOp (Signed)
Patient complaint of nausea.  zofran given as per order.  Refer to kardex.

## 2010-09-19 ENCOUNTER — Inpatient Hospital Stay (HOSPITAL_COMMUNITY): Payer: Medicare Other

## 2010-09-19 LAB — CBC
HCT: 30.5 % — ABNORMAL LOW (ref 33.5–45.2)
MCH: 32.6 pg (ref 27.4–33.0)
MCHC: 35.4 g/dL (ref 31.6–35.5)
MCV: 92.2 fL (ref 82.0–99.0)
MPV: 7.3 FL — ABNORMAL LOW (ref 7.4–10.4)
PLATELET COUNT: 236 THOU/uL (ref 140–450)
RBC: 3.31 MIL/uL — ABNORMAL LOW (ref 3.84–5.04)
RDW: 11.4 % (ref 10.2–14.0)
WBC: 11.3 THOU/uL — ABNORMAL HIGH (ref 3.5–11.0)

## 2010-09-19 LAB — BASIC METABOLIC PANEL
ANION GAP: 10 mmol/L (ref 5–16)
BUN/CREAT RATIO: 15 (ref 6–22)
BUN: 13 mg/dL (ref 6–20)
CALCIUM: 8.7 mg/dL (ref 8.5–10.4)
CARBON DIOXIDE: 25 mmol/L (ref 22–32)
CHLORIDE: 100 mmol/L (ref 96–111)
CREATININE: 0.84 mg/dL (ref 0.49–1.10)
ESTIMATED GLOMERULAR FILTRATION RATE: >59 ml/min/1.73m2 (ref >59)
GLUCOSE,NONFAST: 189 mg/dL — ABNORMAL HIGH (ref 65–139)
POTASSIUM: 4 mmol/L (ref 3.5–5.1)
SODIUM: 135 mmol/L — ABNORMAL LOW (ref 136–145)

## 2010-09-19 LAB — CREATINE KINASE (CK), TOTAL, SERUM OR PLASMA
CREATINE KINASE (CK): 133 U/L (ref 24–170)
CREATINE KINASE (CK): 176 U/L — ABNORMAL HIGH (ref 24–170)
CREATINE KINASE (CK): 186 U/L — ABNORMAL HIGH (ref 24–170)
CREATINE KINASE (CK): 191 U/L — ABNORMAL HIGH (ref 24–170)

## 2010-09-19 LAB — CREATINE KINASE (CK), MB FRACTION, SERUM
CK-MB: 11 ng/mL — ABNORMAL HIGH (ref <6.4)
CK-MB: 14.7 ng/mL — ABNORMAL HIGH (ref <6.4)
CK-MB: 11.9 ng/mL — ABNORMAL HIGH (ref <6.4)
MB INDEX: 8.3 % — ABNORMAL HIGH (ref 0.0–5.0)
MB INDEX: 8.4 % — ABNORMAL HIGH (ref 0.0–5.0)
MB INDEX: 8.2 % — ABNORMAL HIGH (ref 0.0–5.0)
MB INDEX: 6.2 % — ABNORMAL HIGH (ref 0.0–5.0)

## 2010-09-19 LAB — PHOSPHORUS: PHOSPHORUS: 2.8 mg/dL (ref 2.4–4.7)

## 2010-09-19 LAB — TROPONIN-I
TROPONIN-I: 0.104 ng/mL (ref <0.030)
TROPONIN-I: 0.402 ng/mL (ref <0.030)
TROPONIN-I: 1.196 ng/mL (ref <0.030)
TROPONIN-I: 1.45 ng/mL (ref <0.030)
TROPONIN-I: 1.547 ng/mL (ref <0.030)

## 2010-09-19 LAB — B-TYPE NATRIURETIC PEPTIDE (BNP),PLASMA: B-TYPE NATRIURETIC PEPTIDE: 141 pg/mL — ABNORMAL HIGH (ref <100)

## 2010-09-19 LAB — MAGNESIUM: MAGNESIUM: 1.7 mg/dL (ref 1.7–2.5)

## 2010-09-19 LAB — HISTORICAL SURGICAL PATHOLOGY SPECIMEN

## 2010-09-19 MED ORDER — OXYCODONE 5 MG CAPSULE
5.0000 mg | ORAL_CAPSULE | ORAL | Status: DC | PRN
Start: 2010-09-19 — End: 2010-09-21
  Administered 2010-09-19 – 2010-09-20 (×4): 5 mg via ORAL
  Filled 2010-09-19 (×4): qty 1

## 2010-09-19 MED ORDER — CALCIUM 200 MG (AS CALCIUM CARBONATE 500 MG) CHEWABLE TABLET
500.0000 mg | CHEWABLE_TABLET | Freq: Every day | ORAL | Status: DC
Start: 2010-09-19 — End: 2010-09-21
  Administered 2010-09-19 – 2010-09-21 (×3): 500 mg via ORAL
  Filled 2010-09-19 (×3): qty 1

## 2010-09-19 MED ORDER — CLOPIDOGREL 75 MG TABLET
75.0000 mg | ORAL_TABLET | Freq: Every day | ORAL | Status: DC
Start: 2010-09-20 — End: 2010-09-21
  Administered 2010-09-20 – 2010-09-21 (×2): 75 mg via ORAL
  Filled 2010-09-19 (×2): qty 1

## 2010-09-19 MED ORDER — ENOXAPARIN 80 MG/0.8 ML SUBCUTANEOUS SYRINGE
70.00 mg | INJECTION | Freq: Two times a day (BID) | SUBCUTANEOUS | Status: DC
Start: 2010-09-19 — End: 2010-09-21
  Administered 2010-09-19 – 2010-09-21 (×4): 70 mg via SUBCUTANEOUS
  Filled 2010-09-19 (×6): qty 0.8

## 2010-09-19 MED ORDER — PERFLUTREN LIPID MICROSPHERES 1.1 MG/ML INTRAVENOUS SUSPENSION
0.30 mL | INTRAVENOUS | Status: AC
Start: 2010-09-19 — End: 2010-09-19
  Administered 2010-09-19: 0.3 mL via INTRAVENOUS

## 2010-09-19 MED ORDER — CLOPIDOGREL 300 MG TABLET
300.0000 mg | ORAL_TABLET | Freq: Once | ORAL | Status: AC
Start: 2010-09-19 — End: 2010-09-19
  Administered 2010-09-19: 0 mg via ORAL
  Filled 2010-09-19: qty 1

## 2010-09-19 MED ORDER — ATORVASTATIN 10 MG TABLET
20.00 mg | ORAL_TABLET | Freq: Every evening | ORAL | Status: DC
Start: 2010-09-19 — End: 2010-09-21
  Administered 2010-09-19 – 2010-09-20 (×2): 20 mg via ORAL
  Filled 2010-09-19 (×3): qty 2

## 2010-09-19 MED ORDER — ASPIRIN 81 MG CHEWABLE TABLET
324.0000 mg | CHEWABLE_TABLET | Freq: Once | ORAL | Status: AC
Start: 2010-09-19 — End: 2010-09-19
  Administered 2010-09-19: 324 mg via ORAL
  Filled 2010-09-19: qty 4

## 2010-09-19 NOTE — Nurses Notes (Signed)
Let DR Wylie Hail know that tropin is now up to 1.547 with no complaints at this time.

## 2010-09-19 NOTE — Care Management Notes (Signed)
Care Coordinator/Social Work Plan  [place]  Patient Name: [name] MRN: [id number] Acct Number: [id number]  DOB: 03/01/1919 Age: 77  **Admission Information**  Patient Type: INPATIENT  Admit Date: 09/18/2010 Admit Time: 05:25  Admit Reason: 1. put as first case.  Admitting Phys: Elspeth Cho HM  Attending Phys: SZE,EDDIE HM  Unit: 8W Bed: 806-A  100. Adult Admission Assessment  Created by : Bari Mantis Date/Time 2010-09-19 12:28:26.130  Patient Assessment & Education  CCC/MSW met with pt/family/other and provided education on On role of the CM Dept in their hospital stay On role of the  Clinical Care Coordinator On role with discharge/transitional planning, How to contact Care Management Department, [facility]  and MSW.   CCC/MSW reviewed with patient/family/other Transportation plan Cindra Eves, daughter  Mental Status:  Mental Status Cooperative   Current mental status close to baseline Yes   Appointed Health Care Decision Maker:  Advanced Directive Completed: Yes- Whom and contact number Dellie Catholic, son  Communications:  Primary Language: English   In what languge does the patient/caregiver want the educational material documented: English   Does the patient/caregiver need a Translator: No   Living Arrangments  Prior to admission housing arrangements: Alone House/Private Residence   Steps: Number of steps to enter ramp Number of steps inside can remain on same level  If the patient is returning to a home does the home have running water: Yes   If the patient is returning to a home does the home have Electricity: Yes   If the patient is returning to a home does the home have Refrigerator (Medications): Yes   If the patient is returning to a home does the home have Heat: Yes   Support System:  Caregiver availble for assistance with post hospitalization? Yes, Name and phone Greig Castilla and his wife  Agencies the patient was using prior to admission: Hospice    DME the patient was using prior to admission Bedside Commode Walker (wheeled) Wheelchair   Comments:  Note: shower chair - Pt husband used [employer] out of [place], [place]. They lived in [place] until he  passed.   Agencies, Pharmacy, Dialysis and PCP Active Prior to Admission:  Does other members of the household have DME or HH in the home? If so, please specify agency No   Does the patient have prescription coverage? Yes- Patent attorney  Patient's Pharmacy Name & Phone Number [facility], [facility]  Primary Care Physician Practice/MD Name & Phone Number Dr. Unice Bailey, [facility]  Functional Status  Prior to admission Ambulation Status-: Independent   Prior to admission Activities of daily living Independent   Current Admission Ambulation Status- Independent   Current Activities of Daily Living Independent   Additional Information Adult:  Literature Provided to the patient None   Case Discussed with:  Case Discussed with Patient Yes   Was the Patient and/or caregiver(s) able to verbalize understanding of the discharge plan? Yes home with son

## 2010-09-19 NOTE — Progress Notes (Addendum)
S: called by RN that she had a second episode of chest pressure. RN gave her [employer] which helped a little. Associated nausea. Vitals stable. sats 97% on 1L.    Upon my arrival pt awake and alert. Says that her chest is still tight but is feeling ok. Denies SOB. Feeling nauseated.    O: BP 138/65   Pulse 97   Temp 36.6 C (97.9 F)   Resp 18   Ht 1.626 m (5\' 4" )   Wt 69.128 kg (152 lb 6.4 oz)   BMI 26.16 kg/m2   SpO2 93%  Lungs: CTABL  CV: RRR, no murmurs, S1+S2  Abdo: soft, non-tender  Legs: no edema, no calf tenderness    A/P  77 yo POD#1 s/p A/P, enterocele repair, TVT, vaginectomy, cysto   Acute chest pain   Probably acute coronary event   EKG- acute changes, ST depression lead I, ST elevations V1-V2   troponin elevated   Continue telemetry   Spoke with Dr Katherine Roan from medicine - recommends to await second troponin at 8am and to start 342 mg chewable ASA stat  D/w Dr Benay Pillow  Also RN notified to let us know about more CP and to place EKG order right away to see changes  Medicine will follow  Samuel Jester, MD 09/19/2010, 6:23 AM

## 2010-09-19 NOTE — Care Management Notes (Signed)
Care Coordinator/Social Work Plan  [place]  Patient Name: [name] MRN: [id number] Acct Number: [id number]  DOB: 1919/02/12 Age: 77  **Admission Information**  Patient Type: INPATIENT  Admit Date: 09/18/2010 Admit Time: 05:25  Admit Reason: 1. put as first case.  Admitting Phys: Elspeth Cho HM  Attending Phys: SZE,EDDIE HM  Unit: 8W Bed: 806-A  130. CM Assessment Notes  Created by : Bari Mantis Date/Time 2010-09-19 12:43:13.207  Assessment Notes  Note:  Note: HD # 1 77 yo POD#1 s/p A/P, enterocele repair, TVT, vaginectomy, cysto with c/o chest pain Clinical: a/o, 1L NC,  pt had episode of chest pain with EKG changes, troponin q4h - last being 0.402, nitro and ASA given, IV Zofran, TTE  ordered, tolerating po, foley, MIVF @ 30cc/hr, ambulatory when stable D/C: pt will be discharged home when medically  stable.   Consult and Reassessment Will continue to re-assess for changes in plan of care or discharge needs

## 2010-09-19 NOTE — Progress Notes (Addendum)
Cardiology Consulted  Carola [name], MD 09/19/2010, 8:58 AM

## 2010-09-19 NOTE — Nurses Notes (Signed)
Pt complained of chest pain again, Dr. Tawni Levy paged. Dr. Katherine Roan stated to give sublingual nitro tab. Gave to pt and it relieved symptoms. Dr. Tawni Levy also put in an order for pt to take own prescription of tums. Will continue to monitor.

## 2010-09-19 NOTE — Consults (Addendum)
[facility]   Cardiology Consult  Initial Consultation      [name],[name], 77 y.o. female  Date of service: 09/19/2010  Date of Birth:  04/07/1919  Requestion Faculty: Dr. Benay Pillow    Information Obtained from: patient, daughter and history reviewed via medical record  Chief Complaint:  Chest pain    Impression/Recommendations:  Patient is a 77 years old female, admitted for a gynecological procedure, who experienced chest discomfort overnight. EKG showed ST changes on V1 and V2, but these are difficult to interpret reliably in the presence of a pre-existing LBBB    -Patient seen and evaluated with Dr. Leo Rod  -Please obtain a chest x-ray, BNP, cardiac enzymes, transthoracic echocardiogram    Addendum (4 PM):  - Pt's troponin are steadily increasing, now at 1.196, awaiting TTE study.  - We shall go ahead and start pt on Lovenox 1 mg/kg BID, Plavix 300 mg tonight followed by 75 mg QD from tomorrow.   - Lipid panel ordered, pt on Atenolol and started on Lipitor.  - We are hoping to avoid using Glycoprotein IIb IIIa inhibitors on this 77 yo female who is having a NSTEMI. Will continue to follow cardiac enzymes. If the pt stays asymptomatic without much further elevation in enzymes, we aim to medically manage her. However, depending on TTE findings and troponin value, we shall plan whether the pt needs a cardiac catheterization in this hospital admission.   - Informed Dr. Benay Pillow of our assessment and plan.    [name], M.D.  Fellow, Section of Cardiology  [facility]      HPI:     Patient is a 77 years old female, admitted for a gynecological procedure, and is currently admitted under the gynecology service. Patient was in her usual state of health post-operatively till around 1030pm last night when she experienced retrosternal chest heaviness while laying in bed, rated as a 4/10, non-radiating, not associated with nausea, vomiting or diaphoresis. Patient reports relief with Dilaudid but was not completely asymptomatic. Patient re-experienced a similar discomfort at around 430 am which was now associated with nausea, and reported relief with nitroglycerin. EKG done showed a LBBB of unknown duration ( but noted on pre-op EKG as well) with ST-T changes which were difficult to interpret in the presence of a LBBB. Serial troponins were 0.010, 0.104 and 0.402. Patient was administered ASA 324mg , and is currently also on Atenolol 25mg  daily ( decreased yesterday from 50mg  daily which she was on for the last 1 week, as recommended by Dr. Leo Rod)    Patient reports having had dizzy spells around 6 years ago, and had Holter placed which was reportedly normal. She was advised a cardiac catheterisation by her cardiologist in [place] but did not follow up with him.         Cardiac Risk Factors:  hypertension    Past Medical History   Diagnosis Date   . PONV (postoperative nausea and vomiting)    . Hypertension    . History of transfusion 1959     no reaction to blood products   . Glaucoma      lt eye   . Arthritis    . Heartburn      occ. takes TUMS once weekly   . Colon polyp      Hx of   . Obesity    . SOB (shortness of breath) 09/13/2010       Past Surgical History   Procedure  Date   . Hx tonsillectomy    . Hx adenoidectomy    . Hx hysterectomy    . Hx carpal tunnel release      right   . Hx colectomy      with polyp removal   . Hx ankle fracture tx      right ankle   . Hx cataract removal      both, eyes   . Hx bladder suspension 09/18/10       Prior to Admission Medications:   Medications Prior to Admission    Medication    atenolol (TENORMIN) 50 mg Oral Tablet    take 1 Tab by mouth Once a day.    hydrochlorothiazide (MICROZIDE) 12.5 mg Oral Capsule    take 12.5 mg by mouth Once a day.    Acetaminophen (TYLENOL) 500 mg Oral Tablet    take 500 mg by mouth Once a day.    Bimatoprost (BIMATOPROST) 0.03 % Ophthalmic Drops    by Both Eyes route every night.    dorzolamide (TRUSOPT) 2 % Ophthalmic Drops    1 Drop by Left Eye route Three times a day.    Ascorbic Acid (VITAMIN [name]) 1,000 mg Oral Tablet    take 1,000 mg by mouth Once a day.    aspirin 81 mg Oral Tablet, Chewable    take 81 mg by mouth Once a day.    multivitamin Oral Tablet    take 1 Tab by mouth Once a day.    CA CARBONATE/VITAMIN D3/VIT K (CALCIUM CHEW ORAL)    take  by mouth.        Inpatient Medications:    Current facility-administered medications:  calcium carbonate (TUMS) chewable tablet  500 mg Oral Daily   oxyCODONE immediate release (OXY IR) capsule  5 mg Oral Q4H PRN   NS 10 mL injection  2 mL Intracatheter Q8HRS   NS 10 mL injection  2-6 mL Intracatheter Q1 MIN PRN   ondansetron (ZOFRAN) 2 mg/mL injection  4 mg Intravenous Q6H PRN   atenolol (TENORMIN) tablet  25 mg Oral Daily   nitroglycerin (NITROSTAT) sublingual tablet  0.4 mg Sublingual Q5 Min PRN       Allergies   Allergen Reactions   . Phenergan (Promethazine)      Makes her "crazy"       Dye Allergy: Not known   Iodine Allergy:  Not known    Family History  Family History   Problem Relation Age of Onset   . Stroke Father          Social History  History   Substance Use Topics   . Smoking status: Never Smoker    . Smokeless tobacco: Never Used   . Alcohol Use: No          ROS:   Constitutional: No night sweats, weight loss or fevers. No recent travel.   Skin: No jaundice. No itching.   Eyes: No double vision or eye pain.   ENT: No tinnitus. No hearing loss.  Cardiac: Chest pain  Respiratory: Dry cough.Shortness of breath on exertion   Endocrine: No polyuria, polydipsia, or polyphagia. No symptoms of thyroid disease  GI: No abdominal pain, or vomiting. Nausea. No diarrhea or constipation. No black tarry stools. Has had no change in the caliber of stools. No heartburn  GU: No nocturia, frequency, urgency, or dysuria. No history of kidney stones.  Musculoskeletal: No myalgias, arthalgias  Neuro: No focal weakness, no loss of  sensation, No changes in speech, gait or coordination. No changes in swallowing. No double vision.       Exam:  Temperature: 36.7 [name] (98.1 F)  Heart Rate: 77   BP (Non-Invasive): 157/67 mmHg  Respiratory Rate: 18   SpO2-1: 93 %  Pain Score: 3  General: appears in good health, appears younger than stated age and no distress  Eyes: Conjunctiva clear.  HENT:Head atraumatic and normocephalic  Neck: No JVD. Left anterior cervical lymphadeonoathy, non-tender  Lungs: Clear to auscultation bilaterally.   Cardiovascular: regular rate and rhythm, S1, S2 normal, I-II/VI systolic murmur over the right 2nd IC space, with no radiation to carotids appreciated. No rub or gallop  Abdomen: Soft, non-tender, Bowel sounds normal, No hepatosplenomegaly  Extremities: No cyanosis or edema  Skin: Skin warm and dry  Neurologic: Grossly normal  Psychiatric: Normal      Labs:  Lab Results for Last 24 Hours:    Results for orders placed during the hospital encounter of 09/18/10 (from the past 24 hour(s))   TROPONIN-I   Component Value Range   . TROPONIN-I <0.010  <0.030 (ng/mL)   H & H   Component Value Range   . HGB 11.7  11.5 - 15.2 (g/dL)   . HCT 33.1 (*) 33.5 - 45.2 (%)   TROPONIN-I   Component Value Range   . TROPONIN-I 0.010  <0.030 (ng/mL)   BASIC METABOLIC PANEL, NON-FASTING   Component Value Range   . SODIUM 135 (*) 136 - 145 (mmol/L)   . POTASSIUM 4.0  3.5 - 5.1 (mmol/L)   . CHLORIDE 100  96 - 111 (mmol/L)   . CARBON DIOXIDE 25  22 - 32 (mmol/L)   . ANION GAP 10  5 - 16 (mmol/L)   . CREATININE 0.84  0.49 - 1.10 (mg/dL)    . ESTIMATED GLOMERULAR FILTRATION RATE >59  >59 (ml/min/1.42m2)   . GLUCOSE,NONFAST 189 (*) 65 - 139 (mg/dL)   . BUN 13  6 - 20 (mg/dL)   . BUN/CREAT RATIO 15  6 - 22    . CALCIUM 8.7  8.5 - 10.4 (mg/dL)   CBC/DIFF   Component Value Range   . WBC 12.0 (*) 3.5 - 11.0 (THOU/uL)   . RBC 3.52 (*) 3.84 - 5.04 (MIL/uL)   . HGB 11.5  11.5 - 15.2 (g/dL)   . HCT 32.3 (*) 33.5 - 45.2 (%)   . MCV 91.8  82.0 - 99.0 (fL)   . MCH 32.8  27.4 - 33.0 (pg)   . MCHC 35.7 (*) 31.6 - 35.5 (g/dL)   . RDW 11.5  10.2 - 14.0 (%)   . PLATELET COUNT 234  140 - 450 (THOU/uL)   . MPV 7.2 (*) 7.4 - 10.4 (FL)   . PMN'S 85 (*) 40 - 75 (%)   . PMN ABS 10.300 (*) 1.5 - 7.7 (THOU/uL)   . LYMPHOCYTES 8 (*) 20 - 45 (%)   . LYMPHS ABS 0.992 (*) 1.0 - 4.8 (THOU/uL)   . MONOCYTES 6  4 - 13 (%)   . MONOS ABS 0.662  0.1 - 0.9 (THOU/uL)   . EOSINOPHIL 0 (*) 1 - 6 (%)   . EOS ABS 0.017 (*) 0.1 - 0.3 (THOU/uL)   . BASOPHILS 1  0 - 1 (%)   . BASOS ABS 0.078  0.0 - 0.2 (THOU/uL)   . NRBC'S 0  0 (/100WBC)   MAGNESIUM   Component Value Range   . MAGNESIUM 1.7  1.7 - 2.5 (mg/dL)   PHOSPHORUS   Component Value Range   . PHOSPHORUS 2.8  2.4 - 4.7 (mg/dL)   CBC   Component Value Range   . WBC 11.3 (*) 3.5 - 11.0 (THOU/uL)   . RBC 3.31 (*) 3.84 - 5.04 (MIL/uL)   . HGB 10.8 (*) 11.5 - 15.2 (g/dL)   . HCT 30.5 (*) 33.5 - 45.2 (%)   . MCV 92.2  82.0 - 99.0 (fL)   . MCH 32.6  27.4 - 33.0 (pg)   . MCHC 35.4  31.6 - 35.5 (g/dL)   . RDW 11.4  10.2 - 14.0 (%)   . PLATELET COUNT 236  140 - 450 (THOU/uL)   . MPV 7.3 (*) 7.4 - 10.4 (FL)   TYPE AND SCREEN   Component Value Range   . UNITS ORDERED NOT STATED         . ABO/RH(D) B POSITIVE     . ANTIBODY SCREEN NEGATIVE     . SPECIMEN EXPIRATION DATE 09/22/2010     TROPONIN-I   Component Value Range   . TROPONIN-I 0.104 (*) <0.030 (ng/mL)   CREATINE KINASE (CK), TOTAL   Component Value Range   . CREATINE KINASE (CK) 133  24 - 170 (U/L)   TROPONIN-I   Component Value Range   . TROPONIN-I 0.402 (*) <0.030 (ng/mL)        Imaging Studies:    Previous echo results:  Done at [place] last week. Report not available, and patient is unsure of report  Previous ECG results:  LBBB, LAA on EKG done on 09/18/2010  Previous cath results:  NA  Previous stress results:  NA        Haze Rushing, MD  PGY-2 Resident,  Department of Medicine,   [facility].          I saw and examined the patient.  I reviewed the resident's note.  I agree with the findings and plan of care as documented in the resident's note.  Any exceptions/additions are edited/noted.    Marny Lowenstein, MD 09/19/2010, 12:08 PM

## 2010-09-19 NOTE — Nurses Notes (Signed)
Service notified of elevated Troponin of 1.45.

## 2010-09-19 NOTE — Nurses Notes (Signed)
Notified service oftroponin of 0.402.  Patientasymptomatic.

## 2010-09-19 NOTE — Progress Notes (Signed)
[facility] [employer] [facility]   [facility]  Department of Obstetrics & Gynecology   Division of Urogynecology & Reconstructive Pelvic Surgery     Inpatient Progress Note    Name: [name]  MRN: [id number]   DOB: Jul 18, 1918   Date: 09/19/2010     POD #1 PM round.    S: No chest pain or pressure this PM.  Minimal spotting.    O: VS - afebrile and stable.     : Excellent output.     : Pain score - 0.     : O2 saturation -  95%.     : Abdomen - soft and NT.     : Foley draining clear urine.     : Portable CXR showed left atelectatics and pleural effusion.     : Serial troponin I today - 0.104 at 4 am, 0.402 at 8am, 1.196 at 12 noon, and 1.450 at 4 pm.     : Transthoracic echo showed a EF of 35%.    Assessment: stable on Lovenox and Plavix.    Plan: check 8 pm troponin I.  : Call cardiology if torponin I level continues to elevate or develop chest pain/pressure again.  : Will continue to monitor on the flor for now.    Sharod Petsch HM Benay Pillow, MD

## 2010-09-19 NOTE — Progress Notes (Signed)
 [place]  [employer]   [facility]  Department of Obstetrics & Gynecology   Division of Urogynecology & Reconstructive Pelvic Surgery     Inpatient Progress Note    Name: [name]  MRN: [id number]   DOB: 01-Aug-1918   Date: 09/19/2010     POD #1    S: Had two episodes of cheat pain and tightness last night.    : Also felt nauseated with one episode.    : Given ASA and nitrostat .    O: VS - afebrile and stable.     : Excellent output.     : Pain score - 0 to 3.     : O2 saturation -  93% on RA.     : Abdomen - soft and NT.      : Foley draining clear urine.     : Hct - 42% with HCTZ pre-op , 33% last night and 31% this am.     : Troponin I was <0.01 last night and MN, and 0.104 at 4 am.    Assessment: stable at present.    : probably need transfusion.    Plan: Repeat tropoini I at 8 am.  : Ambulate.  : Will check Hct after restarted on HCTZ.  : Reassess at noon.      Everlene Cunning HM Areta, MD

## 2010-09-19 NOTE — Consults (Addendum)
[name]  MEDICINE CONSULT    [name], [name], 77 y.o. female  Date of Admission:  09/18/2010  Date of service: 09/19/2010  Date of Birth:  1918/09/07    [name] Day:  LOS: 1 day     Service: Clayton Bibles  Requesting MD: Benay Pillow    Information Obtained from: patient  Chief Complaint:  Chest Discomfort    Assessment/Recommendations: 77 year old female less than 24 hrs post op from gynecologic procedure now with five minute episode of "smothering" chest discomfort that resolved with dilaudid PCA.  Patient has no cardiac history.  Discussed EKG finding with Cardiology Fellow via telephone.  EKG with changes from baseline, but non diagnostic for acute MI(some ST depressions that were present at baseline but are more pronounced at present, and ST elevation in V1 and V2 which is difficult to interpret in face of preexisting LBBB).  Will obtain serial cardiac enzymes.  Please obtain repeat EKG if patient's symptoms return.  Patient is high risk for CAD.  Risk benefit for anticoagulation, should there be biochemical evidence of MI, is unclear in this very old immediate post op patient.      HPI/Discussion:  [name] is a 77 y.o., White female who presents with 5 minute episode of chest discomfort which resolved with dilaudid.  No associated arm or jaw discomfort, nausea, diaphoresis, or SOB.  Nursing says that when they assessed her during the episosde "she did not look good".  She has no history of CAD or other heart malady.  She is treated for hypertension.  She never smoked, and is not diabetic.  She has had this sensation before, also at rest, which has resolved with tums.  She has no anginal symptoms with activity, but does report stable SOB with activity.  She is a high functioning nonagenarian who performs all her ADLs and light housework, but has someone come in to do "the heavy cleaning".  Her daughter is at bedside and says her mom is at baseline.      Past Medical History   Diagnosis Date    . PONV (postoperative nausea and vomiting)    . Hypertension    . History of transfusion 1959     no reaction to blood products   . Glaucoma      lt eye   . Arthritis    . Heartburn      occ. takes TUMS once weekly   . Colon polyp      Hx of   . Obesity    . SOB (shortness of breath) 09/13/2010       Past Surgical History   Procedure Date   . Hx tonsillectomy    . Hx adenoidectomy    . Hx hysterectomy    . Hx carpal tunnel release      right   . Hx colectomy      with polyp removal   . Hx ankle fracture tx      right ankle   . Hx cataract removal      both, eyes   . Hx bladder suspension 09/18/10       Medications Prior to Admission    Medication    atenolol (TENORMIN) 50 mg Oral Tablet    take 1 Tab by mouth Once a day.    hydrochlorothiazide (MICROZIDE) 12.5 mg Oral Capsule    take 12.5 mg by mouth Once a day.    Ascorbic Acid (VITAMIN C) 1,000 mg Oral Tablet  take 1,000 mg by mouth Once a day.    aspirin 81 mg Oral Tablet, Chewable    take 81 mg by mouth Once a day.    multivitamin Oral Tablet    take 1 Tab by mouth Once a day.    CA CARBONATE/VITAMIN D3/VIT K (CALCIUM CHEW ORAL)    take  by mouth.    Acetaminophen (TYLENOL) 500 mg Oral Tablet    take 500 mg by mouth Once a day.    Bimatoprost (BIMATOPROST) 0.03 % Ophthalmic Drops    by Both Eyes route every night.    dorzolamide (TRUSOPT) 2 % Ophthalmic Drops    1 Drop by Left Eye route Three times a day.          Current facility-administered medications:  NS 10 mL injection  2 mL Intracatheter Q8HRS   NS 10 mL injection  2-6 mL Intracatheter Q1 MIN PRN   enoxaparin (LOVENOX) 40 mg/0.4 mL SubQ injection  40 mg Subcutaneous Q24H   ondansetron (ZOFRAN) 2 mg/mL injection  4 mg Intravenous Q6H PRN   D5W 1/2 NS premix infusion   Intravenous Continuous   HYDROmorphone (DILAUDID) 1 mg/mL (tot vol 100 mL) in NS PCA  1 Bag Intravenous Q1H PRN   diphenhydrAMINE (BENADRYL) 50 mg/mL injection  12.5 mg Intravenous Q6H PRN   atenolol (TENORMIN) tablet  25 mg Oral Daily    nitroglycerin (NITROSTAT) sublingual tablet  0.4 mg Sublingual Q5 Min PRN       Allergies   Allergen Reactions   . Phenergan (Promethazine)      Makes her "crazy"       Family History  Family History   Problem Relation Age of Onset   . Stroke Father          Social History  History   Substance Use Topics   . Smoking status: Never Smoker    . Smokeless tobacco: Never Used   . Alcohol Use: No          ROS:   no fever, chill. No n/v/d.  No difficulty with urination.      EXAM:  Temperature: 36.8 C (98.2 F)  Heart Rate: 85   BP (Non-Invasive): 133/62 mmHg  Respiratory Rate: 18   SpO2-1: 96 %  Pain Score: 3  General: appears in good health, appears younger than stated age and comfortable and cheerful, no distress  Eyes: Conjunctiva clear., Pupils equal and round.   HENT:Head atraumatic and normocephalic  Neck: No JVD  Lungs: Clear to auscultation bilaterally.   Cardiovascular: regular rate and rhythm  Abdomen: Soft, non-tender, Bowel sounds normal  Genito-urinary: Deferred  Extremities: trace lower ex edema  Skin: Skin warm and dry  Neurologic: Grossly normal  Lymphatics: No lymphadenopathy  Psychiatric: Normal    Labs:  I have reviewed all lab results.    Imaging Studies:  N/A    [name], MD  PGY3, Internal Medicine  [name]    I reviewed the findings, assessment, and recommendations. Agree with this note. She has been seen for same problem by Cardiologist today and so I did not see patient; therefore, no fee for service today. - Carolann Littler, MD

## 2010-09-19 NOTE — Progress Notes (Addendum)
Troponin increased slightly. Now 1.547 from 1.450, decrease of CK-MB.  Pt vitally stable. Sitting in bed, reading a book. Denies chest heaviness or the nausea feeling she had last night.    Spoke with Cardiology fellow Dr Channing Mutters, who recommended to continue current management and rpt troponin level in am again. Will also closely monitor her vitals and clinical status. Will call cardiology if any acute change.  Samuel Jester, MD 09/19/2010, 10:31 PM

## 2010-09-19 NOTE — Nurses Notes (Signed)
Patient complained of not feeling right.  Patient complains of feeling funny in her chest and nausea.  BP 126/93 P 78 administered 1 Nitro SL @1020  with no relief Asministered 1 Nitro Sl at 1025 BP 123/61 P 75 with relief noted.  Notified Dr. Suzie Portela, Dr. Noe Gens, and Dr. Benay Pillow.  Dr. Benay Pillow to re contact Cardiology service.

## 2010-09-19 NOTE — Care Management Notes (Signed)
Care Coordinator/Social Work Plan  [place]  Patient Name: [name] MRN: [id number] Acct Number: [id number]  DOB: May 28, 1919 Age: 77  **Admission Information**  Patient Type: INPATIENT  Admit Date: 09/18/2010 Admit Time: 05:25  Admit Reason: 1. put as first case.  Admitting Phys: Elspeth Cho HM  Attending Phys: SZE,EDDIE HM  Unit: 8W Bed: 806-A  160. CMS Important Message / Detailed Notice  Created by : Randa Lynn Date/Time 2010-09-19 82:[id number]  Medicare Important Message/Detailed Notice  Admission- CMS-Important Message from Medicare About Your Rights (CMS-1405) IMM was delivered to the patient. A copy was  provided to the patient. (Date / Time) 09-18-10

## 2010-09-20 ENCOUNTER — Encounter (HOSPITAL_COMMUNITY): Payer: Self-pay | Admitting: Internal Medicine

## 2010-09-20 DIAGNOSIS — I214 Non-ST elevation (NSTEMI) myocardial infarction: Secondary | ICD-10-CM

## 2010-09-20 HISTORY — DX: Non-ST elevation (NSTEMI) myocardial infarction (CMS HCC): I21.4

## 2010-09-20 LAB — LIPID PANEL
CHOLESTEROL: 121 mg/dL (ref <200)
HDL-CHOLESTEROL: 27 mg/dL — ABNORMAL LOW (ref >39)
TRIGLYCERIDES: 406 mg/dL — ABNORMAL HIGH (ref <150)

## 2010-09-20 LAB — CREATINE KINASE (CK), TOTAL, SERUM OR PLASMA: CREATINE KINASE (CK): 133 U/L (ref 24–170)

## 2010-09-20 LAB — CBC
HCT: 26.1 % — ABNORMAL LOW (ref 33.5–45.2)
HGB: 9.4 g/dL — ABNORMAL LOW (ref 11.5–15.2)
MCH: 32.9 pg (ref 27.4–33.0)
MCHC: 35.8 g/dL — ABNORMAL HIGH (ref 31.6–35.5)
MCV: 91.8 fL (ref 82.0–99.0)
MPV: 7.2 FL — ABNORMAL LOW (ref 7.4–10.4)
PLATELET COUNT: 198 THOU/uL (ref 140–450)
RBC: 2.85 MIL/uL — ABNORMAL LOW (ref 3.84–5.04)
RDW: 11.3 % (ref 10.2–14.0)
WBC: 8.7 THOU/uL (ref 3.5–11.0)

## 2010-09-20 LAB — H & H
HCT: 29 % — ABNORMAL LOW (ref 33.5–45.2)
HGB: 10.3 g/dL — ABNORMAL LOW (ref 11.5–15.2)

## 2010-09-20 LAB — LDL CHOLESTEROL, DIRECT: LDL CHOLESTEROL,DIRECT: 49 mg/dL (ref <100)

## 2010-09-20 LAB — CREATINE KINASE (CK), MB FRACTION, SERUM
CK-MB: 7 ng/mL — ABNORMAL HIGH (ref <6.4)
CK-MB: 6.1 ng/mL (ref <6.4)
MB INDEX: 4.6 % (ref 0.0–5.0)

## 2010-09-20 LAB — TROPONIN-I
TROPONIN-I: 1.387 ng/mL (ref <0.030)
TROPONIN-I: 1.497 ng/mL (ref <0.030)

## 2010-09-20 MED ORDER — BISACODYL 5 MG TABLET,DELAYED RELEASE
5.0000 mg | DELAYED_RELEASE_TABLET | Freq: Two times a day (BID) | ORAL | Status: DC
Start: 2010-09-20 — End: 2010-09-21
  Administered 2010-09-20 – 2010-09-21 (×3): 5 mg via ORAL
  Filled 2010-09-20 (×4): qty 1

## 2010-09-20 MED ORDER — ISOSORBIDE MONONITRATE ER 60 MG TABLET,EXTENDED RELEASE 24 HR
60.0000 mg | ORAL_TABLET | Freq: Every day | ORAL | Status: DC
Start: 2010-09-20 — End: 2010-09-21
  Administered 2010-09-20 – 2010-09-21 (×2): 60 mg via ORAL
  Filled 2010-09-20 (×2): qty 1

## 2010-09-20 NOTE — Progress Notes (Signed)
 [place]  [employer]   [facility]  Department of Obstetrics & Gynecology   Division of Urogynecology & Reconstructive Pelvic Surgery     Inpatient Progress Note    Name: [name]  MRN: [id number]   DOB: 1918/08/29   Date: 09/20/2010     POD #2    S: Had another episode of cheat tightness, which radiates to her back last night.    : + flatus.  No BM.    : Minimal spotting.    O: VS - afebrile and stable.     : Good output.     : Pain score - 0 to 3.      : O2 saturation -  97%.     : Abdomen - soft and NT. SP incisions - clean and intact.     : Foley draining clear urine.     : Hct form this am - 26%.     : Troponin I from 2000 of 3/13 - 1.54, from 0300 of 3/14 - 1.39, and 0600 of 3/14 - 1.497.    Assessment: clinically stable but continues to have CP.    Plan: Monitor vaginal bleeding.  : Probably need transfusion.  : Ambulate.  : Dulcolax x 2 days.  : Reassess at noon.      Harvey Matlack HM Areta, MD

## 2010-09-20 NOTE — Nurses Notes (Signed)
Patient states pain has eased up some gave second nitro

## 2010-09-20 NOTE — Nurses Notes (Signed)
1 unit of PRBC's completed, pt denies any comlpaints , vs stable.

## 2010-09-20 NOTE — Nurses Notes (Signed)
Patient alert and oriented, resting comfortably in bed at this time- family at bedside. Patient denies any pain. Mady Haagensen was completed by family member. Foley cath removed, waiting on patient to void. Encouraging PO intake at this time. Will monitor. Renada Cronin Ladona Horns, RN 09/20/2010, 8:57 PM

## 2010-09-20 NOTE — Nurses Notes (Signed)
Patient is having indigestion and pain in the upper back felt different than just indigestion gave one nitro.

## 2010-09-20 NOTE — Nurses Notes (Signed)
Notified Dr Tawni Levy will come and see her. B/P 109/53 pulse 78.

## 2010-09-20 NOTE — Nurses Notes (Signed)
Pt started on 1 unit of PRB's at this time, will monitor.

## 2010-09-20 NOTE — Nurses Notes (Signed)
Removed pt's catheter without difficulty.

## 2010-09-20 NOTE — Progress Notes (Signed)
[facility] [employer]   [facility]  Department of Obstetrics & Gynecology   Division of Urogynecology & Reconstructive Pelvic Surgery     Inpatient Progress Note    Name: [name]  MRN: [id number]   DOB: 1918-11-16   Date: 09/20/2010     POD #2    S: No complaint. No BM.    O: VS - afebrile and stable.     : Good output.     : Pain score - 0 to 3.     : O2 saturation - 96%.      : Abdomen - soft and NT. SP incisions - clean and intact.       Assessment: stable    Plan: Check PVR at 2000.  : Reassess in am.      Liz Beach, MD

## 2010-09-20 NOTE — Progress Notes (Addendum)
S: called by RN that pt is having chest pressure. Gave 2 nitro's SL which eased the discomfort. Vitals are stable. B/P 109/53 pulse 78, O2 sat 97% on 1L. Pt reports radiation to her back. Denies SOB or nausea.    O: BP 109/53   Pulse 78   Temp 36.6 C (97.8 F)   Resp 20   Ht 1.626 m (5\' 4" )   Wt 69.128 kg (152 lb 6.4 oz)   BMI 26.16 kg/m2   SpO2 97%  Gen:NAD, lying in bed, looks comfortable  Lungs: clear to auscultation, no wheezes or crackles  CV: RRR, S1+S2 no murmurs/rubs/gallops    A/P  77 yo POD#2 A/P, enterocele repair, TVT, vaginectomy, cysto   Postop NSTEMI   Again onset of CP  Order stat EKG and troponin/CK-MB  Continue telemetry  D/w Dr Channing Mutters from cardio - await labs, continue current management since pt is stable and also receiving treatment  Samuel Jester, MD 09/20/2010, 3:29 AM

## 2010-09-20 NOTE — Consults (Addendum)
[facility]  Cardiology CONSULT  Follow Up Note    [name],[name], 77 y.o. female  Date of Service: 09/20/2010  Date of Birth:  1919/02/23    Hospital Day:  LOS: 2 days     Chief Complaint:  NSTEMI    Impression/Recommendations:  Patient is a 77 years old female, NSTEMI after a gynecological surgery, old LBBB, max troponin 1.547.  - TTE yesterday showed Ischemic cardiomyopathy with no severe valvular disease. Left ventricle systolic function was mildly to moderately reduced. The estimated ejection fraction was likely around 35%. Hypokinesis of the mid-distal anteroseptal myocardium. Probable dyskinesis of the apical myocardium. Mild left ventricular diastolic dysfunction. There has not been a significant worsening of her EF or wall motion abnormalities in this new echo as compared to the preoperative one.  - We would like to continue with medical management. Lovenox 1 mg/kg BID is usually recommended "for duration of hospitalization" per guidelines, (two to five days in most trials).  Continue Plavix 75 mg QD, Atenolol 25 mg qd, Lipitor 20 mg QD. Add Imdur 60 mg QD to her regimen.  Given recent surgery and age and the questionable benefit of ASA in addition to Plavix, we would not recommend ASA therapy for this patient.  - We are going to follow the pt's blood pressure and if she tolerates introduction of Imdur, we can add an ACE-I to her regimen. If we are unable to start an ACE-I during this hospitalization, this will be addressed once she returns to clinic.   - Please have the pt follow with Dr. Leo Rod in 4 weeks after her D/[name] (order placed).   - Will continue to follow. Call with questions, as needed.    Kandy Garrison, M.D.  Fellow, Section of Cardiology  [facility]      Subjective: Pt stable overnight, one episode of chest pain, which resolved with SL nitroglycerin.     Objective:  Temperature: 36.6 [name] (97.9 F)  Heart Rate: 74    BP (Non-Invasive): 149/66 mmHg  Respiratory Rate: 20   SpO2-1: 99 %  Pain Score: 1  General: appears in good health, appears younger than stated age and no distress   Eyes: Conjunctiva clear.   HENT: Head atraumatic and normocephalic   Lungs: Clear to auscultation bilaterally.   Cardiovascular: regular rate and rhythm, S1, S2 normal, I-II/VI systolic murmur over the right 2nd IC space, with no radiation to carotids appreciated.   Extremities: No cyanosis or edema   Skin: Skin warm and dry   Neurologic: Grossly normal     Labs:  Last 3 Cardiac Markers:    Recent Labs   Basename 09/20/10 0600 09/20/10 0315 09/19/10 2000 09/19/10 1600   . TROPONINI 1.497* 1.387* 1.547* --   . CPK 133 -- 191* 186*   . CKMB 6.1 7.0* 11.9* --         Imaging Studies:  CXR:  Stable cardiomegaly, left lower lobe atelectasis and left-sided pleural effusion.    Silver Huguenin, MD 09/20/2010, 9:28 AM      I did not examine this patient directly on this day. The patient's care was discussed with me. I agree with the treatment plan as outlined above.   Marny Lowenstein, MD

## 2010-09-21 LAB — TYPE AND SCREEN
ABO/RH(D): B POS
ANTIBODY SCREEN: NEGATIVE
UNITS ORDERED: 1

## 2010-09-21 MED ORDER — OXYCODONE-ACETAMINOPHEN 5 MG-325 MG TABLET
ORAL_TABLET | ORAL | Status: AC
Start: 2010-09-21 — End: 2010-09-21
  Filled 2010-09-21: qty 1

## 2010-09-21 MED ORDER — OXYCODONE 5 MG CAPSULE
5.00 mg | ORAL_CAPSULE | ORAL | Status: DC | PRN
Start: 2010-09-21 — End: 2010-09-29

## 2010-09-21 MED ORDER — LISINOPRIL 2.5 MG TABLET
2.5000 mg | ORAL_TABLET | Freq: Every day | ORAL | Status: DC
Start: 2010-09-21 — End: 2010-09-21
  Filled 2010-09-21: qty 1

## 2010-09-21 NOTE — Progress Notes (Addendum)
[facility] [employer] [facility]   [facility]  Department of Obstetrics & Gynecology   Division of Urogynecology & Reconstructive Pelvic Surgery     Inpatient Progress Note    Name: [name]  MRN: [id number]   DOB: 10-Sep-1918   Date: 09/21/2010     POD #3    S: Ambulating, voiding, and taking PO well.    : +BM.    O: VS - afebrile and stable.     : Good output.     : Blood sugar -      : Pain score - 0 to 3.     : Abdomen - soft and NT. SP incisions - clean and intact.     : Postop Hct - 29% after one unit of PC.    Assessment: stable  : Hct will probably increase when restart HCTZ.  : No further CP.    Plan: will discharge today after cardiology decides whether to start ASA, ACEI, and her HCTZ.      [name] HM Benay Pillow, MD

## 2010-09-21 NOTE — Nurses Notes (Signed)
Discharge to home with family.  PIV pulled, tele pack removed and given to clerk..  Given discharge instructions and scripts, no questions.  RTN PRN.

## 2010-09-21 NOTE — CDI WORKSHEET (Addendum)
DRG NLV   Working DRG 1: 760 Menstrual & other female reproductive system disorders w CC/MCC  3.3     Final MS-DRG:  746 Vagina, Cervix and Vulva Procedures w CC/MCC     Reason Code: 2-Subsequent Treatment or Documentation    Comments:     Final Coder:  Brett Fairy      Principle Diagnosis:  Vaginal prolapse       Principle Procedure: Procedure Date:   Secondary Dx:    Vag prolapse  NSTEMI  Urinary incontinence  LBBB  CAD   HTN  ICM Secondary Procedures:     Past Medical Hx:     Comments:  3/15 No operative report yet.     Home Medications:

## 2010-09-21 NOTE — Progress Notes (Signed)
[facility] [employer] [facility]   [facility]  Department of Obstetrics & Gynecology   Division of Urogynecology & Reconstructive Pelvic Surgery     Inpatient Progress Note    Name: [name]  MRN: [id number]   DOB: June 04, 1919   Date: 09/21/2010     POD #3    S: Ambulating, voiding, and taking PO well.    : +BM.    : No further CP or pressure.    O: VS - afebrile and stable.     : Good output.     : Pain score - 0 to 3.     : O2 saturation - 97%.      : Abdomen - soft and NT. SP incisions - clean and intact.    Assessment: stable.  : Postop MI.  : HTN.  : Glaucoma.    Plan: Discharge home.  : Regular diet.  : Modify activity.  : Follow-up 6 weeks.  : Call with any problems.  : Printed discharge instructions given and reviewed.  : Oxycodone 5-mg, #20, given x pain.  : Atenolol 25 mg qd, Imdur 60 mg qd, Plavix 75 mg qd, Lisinopril 2.5 mg qd, Lipitor 20 mg qd.  : Follow-up Dr. Leo Rod in 4 weeks.    Alyrica Thurow HM Benay Pillow, MD

## 2010-09-21 NOTE — Consults (Addendum)
[facility]  Quality Care Measures-[facility]      Per discussion with  Haze Rushing, MD :  Patient unable to be given ACE I/ ARB for EF    35%   related to  low blood pressure .  No ASA at this time related to concern for low HGB and HCT (monitoring).      Pt was to be started on ACE-I prior to discharge.  Please see progress notes for rationale behind not using ASA in this patient.        Marny Lowenstein, MD

## 2010-09-21 NOTE — CDI REVIEW (Signed)
Admissions, Findings, and Consultations:  3/12 GYN--vaginal prolapse urinary incontinence.    3/12 H&P update form.  3/7 H&P--same  3/12 GYN--post op check  3/12 GYN--cp with radiation to left arm.  EKG shows LBBB.  Acute EKG changes.  With suspicious h/o CAD.   Med. Consult.  3/13 Med--obesity  3/13 GYN--chest pain again.   Probably acute coronary event.   3/13 GYN--reassess at noon.   3/13 Cards--consult.   3/13 Cards--NSTEMI.   3/13 GYN--cont. Poc.  3/13 GYN--increasing troponins  3/14 GYN--post op NSTEMI  3/14 GYN--cont to monitor.  3/14 Cards--ICM.  Continue with medical management.  Stable cardiomegaly.  Left sided pleural effusion.  3/14 GYN--no c/o.  3/15 GYN--may d/c today.  No further CP       Diagnostics and Results:   Procedures and Treatments:   Meds, IV's, Rx Blood:   Comments:

## 2010-09-21 NOTE — Consults (Addendum)
[facility]  Cardiology CONSULT  Follow Up Note    Marsicano,Anysia C, 77 y.o. female  Date of Service: 09/21/2010  Date of Birth:  [birth year]    Hospital Day:  LOS: 3 days     Chief Complaint:  NSTEMI    Impression/Recommendations:  Patient is a 77 years old female, NSTEMI after a gynecological surgery, old LBBB, max troponin 1.547.  - Pt stable overnight. At discharge, please continue Plavix 75 mg QD, Atenolol 25 mg qd, Lipitor 20 mg QD, Imdur 60 mg QD. Since her BP has remained stable, we shall add Lisinopril 2.5 mg QD to her regimen for her low LVEF (continue at D/C).   - We are going to hold off on re-starting her Aspirin (see previous note). At this stage, we prefer to start her on Plavix first.   - Please have the pt follow with Dr. Leo Rod in 4 weeks after her D/C (order placed).   - Call with questions, as needed.    Kandy Garrison, M.D.   Fellow, Section of Cardiology   [facility]    Subjective: Pt stable overnight, no more episodes of chest pain, s/p transfusion of 1 U PRBC, last Hb > 10.3.    Objective:  Temperature: 37.1 C (98.8 F)  Heart Rate: 69   BP (Non-Invasive): 116/58 mmHg  Respiratory Rate: 18   SpO2-1: 94 %  Pain Score: 2  General: appears in good health, appears younger than stated age and no distress   Eyes: Conjunctiva clear.   HENT: Head atraumatic and normocephalic   Lungs: Clear to auscultation bilaterally.   Cardiovascular: regular rate and rhythm, S1, S2 normal, I-II/VI systolic murmur over the right 2nd IC space, with no radiation to carotids appreciated.   Extremities: No cyanosis or edema   Skin: Skin warm and dry   Neurologic: Grossly normal     Labs and Imaging Studies:  reviewed    Silver Huguenin, MD 09/21/2010, 12:52 PM      I did not examine this patient directly. The patient's care was discussed with me. I agree with the treatment plan as outlined above.   Marny Lowenstein, MD

## 2010-09-22 NOTE — Discharge Summary (Signed)
[facility]                                DEPARTMENT OF OBSTETRICS AND GYNECOLOGY                                              DISCHARGE SUMMARY    PATIENT NAME: [name], [name]  HOSPITAL [id number]  DATE OF BIRTH: [birth year]    ADMISSION DATE: 09/18/2010  DISCHARGE DATE: 09/21/2010    PRINCIPAL DIAGNOSES:  1. Symptomatic vaginal prolapse.  2. Stress urinary incontinence.  3. NSTMI.     OPERATIVE PROCEDURES:    1. Anterior, posterior, and enterocele repair.  2. Placement of tension-free vaginal tape.  3. Vaginectomy.  4. Cystoscopy.     Ms. [name] is a 77 year old WMF with symptomatic vaginal prolapse and stress urinary incontinence who was admitted for surgical repair.  A preoperative cardiac evaluation was ordered because her EKG showed LBBB. Her echocardiogram showed a EF of ~35%.  Postoperatively, her EKG in the recovery room was normal.  On the evening of her surgery, [name] complained of chest tightness and pain.  Her troponin was elevated.  A cardiac consult was requested. Serial levels showed that her troponin level reached a maximum level of 1.547.  A chest x-ray showed acute atelectasis and left pleural effusion.  Her postop Hct was approximately 30%.  An echocardiogram showed ejection fraction of approximately 35%, which was unchanged, and a normal functioning left ventricle.  [name] was diagnosed to have NSTMI.  She was started on Lovenox and Plavix.  [name] experienced another episode of chest tightness and pain on the evening of POD #1.  She was started on Imdur. Her condition subsequently stabilized and was discharged home on postoperative day #3.  At that time she was ambulating, voiding, and taking po well.  Her bowel function has also returned to normal.  Examination showed the patient to be afebrile throughout her hospital course with stable vital signs, excellent urine output.  Her abdomen was soft and nontender.  The suprapubic incisions were clean and intact.  A repeat hematocrit was 26%. [name] was given one unit of packed cells.    DISCHARGE INSTRUCTIONS:     1.   Regular diet.  2.   Modified activity.  3.   Follow-up in 6 weeks.  4.   Follow-up with Dr. Christell Constant in [place] in 4 weeks.  5.   Printed discharge instructions were given and reviewed.    6.   Oxycodone 5 mg, #20, was given for pain.    7.   Atenolol 25 mg daily.  8.   Imdur 60 mg daily.  9.   Plavix 75 mg daily.  10. Lisinopril 2.5 mg daily.  11. Lipitor 25 mg daily.  12. Call with any problems.    Liz Beach, MD  Associate Professor   [facility] Department of Obstetrics and Gynecology    [id number]; D: 09/21/2010 15:03:10; T: 09/22/2010 07:12:09

## 2010-09-24 ENCOUNTER — Telehealth (INDEPENDENT_AMBULATORY_CARE_PROVIDER_SITE_OTHER): Payer: Self-pay | Admitting: Obstetrics & Gynecology

## 2010-09-24 NOTE — Telephone Encounter (Addendum)
Called Ms. Cizek.  Recommended using Dulcolax supp and repeat in 4 hours as needed.  To take one MOM pill at bedtime prn if did not have a BM that day.    Tearsa Kowalewski HM Benay Pillow, MD

## 2010-09-24 NOTE — Telephone Encounter (Signed)
[name]  Mar 13, 1919  [id number]    09/24/2010  0818    Received call from MARS line from patient's daughter.  States patient has not bad BM since Thursday and is having some cramping and nausea.  Patient had anterior/posterior repair and TVT surgery on 09/18/10  with Dr. Benay Pillow with a post-operative NSTEMI.    Contacted Dr. Benay Pillow and let him know of patient's complaints.  Dr. Benay Pillow will call patient and daughter and speak with them.  Phone #[phone].    Truddie Coco, MD 09/24/2010, 10:40 AM

## 2010-09-25 ENCOUNTER — Other Ambulatory Visit (HOSPITAL_BASED_OUTPATIENT_CLINIC_OR_DEPARTMENT_OTHER): Payer: Self-pay | Admitting: Interventional Cardiology

## 2010-09-25 MED ORDER — NITROGLYCERIN 0.4 MG SUBLINGUAL TABLET
0.40 mg | SUBLINGUAL_TABLET | SUBLINGUAL | Status: DC | PRN
Start: 2010-09-25 — End: 2011-10-24

## 2010-09-26 ENCOUNTER — Encounter (HOSPITAL_BASED_OUTPATIENT_CLINIC_OR_DEPARTMENT_OTHER): Payer: Self-pay | Admitting: Hospital-Specialty Hospital

## 2010-09-26 NOTE — Progress Notes (Signed)
[facility] Medicine   Department of Obstetrics & Gynecology   Division of Urogynecology & Reconstructive Pelvic Surgery     Outpatient Progress Note    Name: [name]  MRN: [id number]   DOB: 14-Oct-1918   Date: 09/26/2010     Called [name] for follow-up.  Had numbness in both legs this morning.  Also has intermittent nausea.  Normal bowel movements.    Recommend take BP tid x 3 days to check for hypotension.  Talk to pharmacist regarding whether her new medications side effects and possible ways to prevent them.  Call with problems.      Yao Hyppolite HM Benay Pillow, MD

## 2010-09-28 ENCOUNTER — Encounter (HOSPITAL_BASED_OUTPATIENT_CLINIC_OR_DEPARTMENT_OTHER): Payer: Self-pay | Admitting: Hospital-Specialty Hospital

## 2010-09-28 NOTE — Progress Notes (Signed)
 [place]  [employer] [facility]   Department of Obstetrics & Gynecology   Division of Urogynecology & Reconstructive Pelvic Surgery     Outpatient Progress Note    Name: DERETHA ERTLE  MRN: [id number]   DOB: 11-Dec-1918   Date: 09/28/2010     Called Ms. Maciejewski for follow-up.  Voiding without difficulty.  Her bowel function is very normal.    Still has nausea intermittently and has funny feeling in her chest at night.  Has not been sleeping well.  Feels better to sit up at night.  Has intermittent nausea feeling before the surgery.    Her appt with Dr. Delinda is in April 14.      Her BP - 115/65 and 119/72 on 3/21.  170/94, 135/85, 150/90 on 3/22.  160/84 this am.    Recommend to call Dr. Delinda 's office for follow-up asap.      Lisett Dirusso HM Areta, MD

## 2010-09-29 ENCOUNTER — Inpatient Hospital Stay (HOSPITAL_COMMUNITY): Payer: Medicare Other | Admitting: Interventional Cardiology

## 2010-09-29 ENCOUNTER — Inpatient Hospital Stay
Admission: AD | Admit: 2010-09-29 | Discharge: 2010-09-30 | DRG: 248 | Disposition: A | Discharge status: Home / Self Care | Payer: Medicare Other | Source: Other Acute Inpatient Hospital | Attending: Interventional Cardiology | Admitting: Interventional Cardiology

## 2010-09-29 ENCOUNTER — Inpatient Hospital Stay (HOSPITAL_COMMUNITY): Payer: Medicare Other

## 2010-09-29 ENCOUNTER — Encounter

## 2010-09-29 DIAGNOSIS — Z79899 Other long term (current) drug therapy: Secondary | ICD-10-CM

## 2010-09-29 DIAGNOSIS — I2589 Other forms of chronic ischemic heart disease: Secondary | ICD-10-CM | POA: Diagnosis present

## 2010-09-29 DIAGNOSIS — I200 Unstable angina: Secondary | ICD-10-CM | POA: Diagnosis present

## 2010-09-29 DIAGNOSIS — I10 Essential (primary) hypertension: Secondary | ICD-10-CM | POA: Diagnosis present

## 2010-09-29 DIAGNOSIS — I2510 Atherosclerotic heart disease of native coronary artery without angina pectoris: Principal | ICD-10-CM | POA: Diagnosis present

## 2010-09-29 DIAGNOSIS — I214 Non-ST elevation (NSTEMI) myocardial infarction: Secondary | ICD-10-CM | POA: Diagnosis present

## 2010-09-29 DIAGNOSIS — I447 Left bundle-branch block, unspecified: Secondary | ICD-10-CM | POA: Diagnosis present

## 2010-09-29 DIAGNOSIS — R12 Heartburn: Secondary | ICD-10-CM | POA: Diagnosis present

## 2010-09-29 DIAGNOSIS — M129 Arthropathy, unspecified: Secondary | ICD-10-CM | POA: Diagnosis present

## 2010-09-29 DIAGNOSIS — E669 Obesity, unspecified: Secondary | ICD-10-CM | POA: Diagnosis present

## 2010-09-29 LAB — CBC/DIFF
BASOPHILS: 0 % (ref 0–1)
BASOS ABS: 0.025 THOU/uL (ref 0.0–0.2)
EOS ABS: 0.102 10*3/uL (ref 0.1–0.3)
EOSINOPHIL: 1 % (ref 1–6)
HCT: 35 % (ref 33.5–45.2)
HGB: 11.8 g/dL (ref 11.5–15.2)
LYMPHOCYTES: 21 % (ref 20–45)
LYMPHS ABS: 1.7 THOU/uL (ref 1.0–4.8)
MCH: 31 pg (ref 27.4–33.0)
MCV: 92.1 fL (ref 82.0–99.0)
MONOCYTES: 9 % (ref 4–13)
MONOS ABS: 0.731 THOU/uL (ref 0.1–0.9)
MPV: 7.1 FL — ABNORMAL LOW (ref 7.4–10.4)
NRBC'S: 0 /100{WBCs}
PLATELET COUNT: 316 THOU/uL (ref 140–450)
PMN ABS: 5.48 THOU/uL (ref 1.5–7.7)
PMN'S: 69 % (ref 40–75)
RBC: 3.79 MIL/uL — ABNORMAL LOW (ref 3.84–5.04)
RDW: 12.7 % (ref 10.2–14.0)
WBC: 8 THOU/uL (ref 3.5–11.0)

## 2010-09-29 LAB — BUN
BUN/CREAT RATIO: 15 (ref 6–22)
BUN: 12 mg/dL (ref 6–20)

## 2010-09-29 LAB — PT/INR
INR: 1 (ref 0.8–1.2)
PROTHROMBIN TIME: 10.3 s (ref 9.1–11.5)

## 2010-09-29 LAB — ELECTROLYTES
ANION GAP: 9 mmol/L (ref 5–16)
CARBON DIOXIDE: 26 mmol/L (ref 22–32)
CHLORIDE: 103 mmol/L (ref 96–111)
POTASSIUM: 4.2 mmol/L (ref 3.5–5.1)
SODIUM: 138 mmol/L (ref 136–145)

## 2010-09-29 LAB — CREATININE WITH EGFR
CREATININE: 0.81 mg/dL (ref 0.49–1.10)
ESTIMATED GLOMERULAR FILTRATION RATE: >59 ml/min/1.73m2 (ref >59)

## 2010-09-29 LAB — CREATINE KINASE (CK), TOTAL, SERUM OR PLASMA
CREATINE KINASE (CK): 30 U/L (ref 24–170)
CREATINE KINASE (CK): 33 U/L (ref 24–170)

## 2010-09-29 LAB — B-TYPE NATRIURETIC PEPTIDE (BNP),PLASMA: B-TYPE NATRIURETIC PEPTIDE: 177 pg/mL — ABNORMAL HIGH (ref <100)

## 2010-09-29 LAB — CREATINE KINASE (CK), MB FRACTION, SERUM
CK-MB: 1.3 ng/mL (ref <6.4)
CK-MB: 1.5 ng/mL (ref <6.4)
MB INDEX: 4.3 % (ref 0.0–5.0)
MB INDEX: 4.5 % (ref 0.0–5.0)

## 2010-09-29 LAB — TROPONIN-I
TROPONIN-I: 0.032 ng/mL (ref <0.030)
TROPONIN-I: 0.027 ng/mL (ref <0.030)

## 2010-09-29 LAB — PTT (PARTIAL THROMBOPLASTIN TIME): APTT: 23.5 s (ref 22.0–32.0)

## 2010-09-29 MED ORDER — WATER FOR INJECTION, STERILE INJECTION SOLUTION
INTRAMUSCULAR | Status: AC
Start: 2010-09-29 — End: 2010-09-29
  Filled 2010-09-29: qty 10

## 2010-09-29 MED ORDER — ACETAMINOPHEN 325 MG TABLET
650.0000 mg | ORAL_TABLET | ORAL | Status: DC | PRN
Start: 2010-09-29 — End: 2010-09-30
  Administered 2010-09-29: 650 mg via ORAL
  Filled 2010-09-29: qty 2

## 2010-09-29 MED ORDER — SODIUM CHLORIDE 0.9 % (FLUSH) INJECTION SYRINGE
2.00 mL | INJECTION | Freq: Three times a day (TID) | INTRAMUSCULAR | Status: DC
Start: 2010-09-29 — End: 2010-09-30
  Administered 2010-09-29 – 2010-09-30 (×2): 2 mL

## 2010-09-29 MED ORDER — ATENOLOL 50 MG TABLET
50.0000 mg | ORAL_TABLET | Freq: Every day | ORAL | Status: DC
Start: 2010-09-30 — End: 2010-09-30
  Administered 2010-09-30: 50 mg via ORAL
  Filled 2010-09-29: qty 1

## 2010-09-29 MED ORDER — MIDAZOLAM 1 MG/ML INJECTION SOLUTION
INTRAMUSCULAR | Status: AC
Start: 2010-09-29 — End: 2010-09-29
  Filled 2010-09-29: qty 2

## 2010-09-29 MED ORDER — VERAPAMIL 2.5 MG/ML INTRAVENOUS SOLUTION
INTRAVENOUS | Status: AC
Start: 2010-09-29 — End: 2010-09-29
  Filled 2010-09-29: qty 2

## 2010-09-29 MED ORDER — SODIUM CHLORIDE 0.9 % (FLUSH) INJECTION SYRINGE
2.00 mL | INJECTION | INTRAMUSCULAR | Status: DC | PRN
Start: 2010-09-29 — End: 2010-09-30

## 2010-09-29 MED ORDER — NITROGLYCERIN 0.4 MG SUBLINGUAL TABLET
0.40 mg | SUBLINGUAL_TABLET | SUBLINGUAL | Status: DC | PRN
Start: 2010-09-29 — End: 2010-09-30

## 2010-09-29 MED ORDER — VERAPAMIL 2.5 MG/ML INTRAVENOUS SOLUTION
5.00 mg | INTRAVENOUS | Status: AC
Start: 2010-09-29 — End: 2010-09-29
  Administered 2010-09-29: 5 mg via INTRA_ARTERIAL

## 2010-09-29 MED ORDER — CLOPIDOGREL 75 MG TABLET
75.0000 mg | ORAL_TABLET | Freq: Every day | ORAL | Status: DC
Start: 2010-09-30 — End: 2010-09-30
  Administered 2010-09-30: 75 mg via ORAL
  Filled 2010-09-29: qty 1

## 2010-09-29 MED ORDER — MULTIVITAMIN TABLET
1.0000 | ORAL_TABLET | Freq: Every day | ORAL | Status: DC
Start: 2010-09-30 — End: 2010-09-30
  Administered 2010-09-30: 1 via ORAL
  Filled 2010-09-29: qty 1

## 2010-09-29 MED ORDER — ASCORBIC ACID (VITAMIN C) 500 MG TABLET
1000.0000 mg | ORAL_TABLET | Freq: Every day | ORAL | Status: DC
Start: 2010-09-30 — End: 2010-09-30
  Administered 2010-09-30: 1000 mg via ORAL
  Filled 2010-09-29: qty 2

## 2010-09-29 MED ORDER — DORZOLAMIDE 2 % EYE DROPS
1.00 [drp] | Freq: Three times a day (TID) | OPHTHALMIC | Status: DC
Start: 2010-09-29 — End: 2010-09-30
  Administered 2010-09-29: 1 [drp] via OPHTHALMIC
  Administered 2010-09-29: 0 [drp] via OPHTHALMIC
  Administered 2010-09-30: 1 [drp] via OPHTHALMIC
  Filled 2010-09-29: qty 10

## 2010-09-29 MED ORDER — SODIUM CHLORIDE 0.9 % INTRAVENOUS SOLUTION
INTRAVENOUS | Status: DC
Start: 2010-09-29 — End: 2010-09-29
  Administered 2010-09-29: 0 via INTRAVENOUS

## 2010-09-29 MED ORDER — ASCORBIC ACID (VITAMIN C) 1,000 MG TABLET
1000.0000 mg | ORAL_TABLET | Freq: Every day | ORAL | Status: DC
Start: 2010-09-30 — End: 2010-09-29

## 2010-09-29 MED ORDER — ASPIRIN 81 MG CHEWABLE TABLET
81.0000 mg | CHEWABLE_TABLET | Freq: Every day | ORAL | Status: DC
Start: 2010-09-29 — End: 2010-09-30
  Administered 2010-09-29 – 2010-09-30 (×2): 81 mg via ORAL
  Filled 2010-09-29 (×2): qty 1

## 2010-09-29 MED ORDER — MIDAZOLAM 1 MG/ML INJECTION SOLUTION
0.50 mg | INTRAMUSCULAR | Status: AC | PRN
Start: 2010-09-29 — End: 2010-09-29
  Administered 2010-09-29: 0.5 mg via INTRAVENOUS
  Administered 2010-09-29: 1 mg via INTRAVENOUS
  Administered 2010-09-29 (×2): 0.5 mg via INTRAVENOUS

## 2010-09-29 MED ORDER — HEPARIN (PORCINE) 1,000 UNIT/ML INJECTION SOLUTION
INTRAMUSCULAR | Status: AC
Start: 2010-09-29 — End: 2010-09-29
  Filled 2010-09-29: qty 10

## 2010-09-29 MED ORDER — BIMATOPROST 0.03 % EYE DROPS
1.00 [drp] | Freq: Every evening | OPHTHALMIC | Status: DC
Start: 2010-09-29 — End: 2010-09-30
  Administered 2010-09-29: 0 [drp] via OPHTHALMIC

## 2010-09-29 MED ORDER — ESOMEPRAZOLE MAGNESIUM 40 MG CAPSULE,DELAYED RELEASE
40.0000 mg | DELAYED_RELEASE_CAPSULE | Freq: Every morning | ORAL | Status: DC
Start: 2010-09-30 — End: 2010-09-30
  Administered 2010-09-30: 40 mg via ORAL
  Filled 2010-09-29: qty 1

## 2010-09-29 MED ORDER — DORZOLAMIDE 2 % EYE DROPS
1.00 [drp] | Freq: Three times a day (TID) | OPHTHALMIC | Status: DC
Start: 2010-09-29 — End: 2010-09-30
  Administered 2010-09-29: 0 [drp] via OPHTHALMIC
  Administered 2010-09-30: 1 [drp] via OPHTHALMIC
  Filled 2010-09-29: qty 10

## 2010-09-29 MED ORDER — BIVALIRUDIN 5 MG/ML BOLUS INJECTION ECMO
0.56 mg/kg | INTRAVENOUS | Status: AC
Start: 2010-09-29 — End: 2010-09-29
  Administered 2010-09-29: 38 mg via INTRAVENOUS

## 2010-09-29 MED ORDER — LIDOCAINE HCL 20 MG/ML (2 %) INJECTION SOLUTION
INTRAMUSCULAR | Status: DC
Start: 2010-09-29 — End: 2010-09-30

## 2010-09-29 MED ORDER — IOVERSOL 320 MG IODINE/ML INTRAVENOUS SOLUTION
100.00 mL | INTRAVENOUS | Status: AC
Start: 2010-09-29 — End: 2010-09-29
  Administered 2010-09-29: 155 mL via INTRA_ARTERIAL

## 2010-09-29 MED ORDER — FENTANYL (PF) 50 MCG/ML INJECTION SOLUTION
INTRAMUSCULAR | Status: AC
Start: 2010-09-29 — End: 2010-09-29
  Filled 2010-09-29: qty 2

## 2010-09-29 MED ORDER — FENTANYL (PF) 50 MCG/ML INJECTION SOLUTION
25.00 ug | INTRAMUSCULAR | Status: AC | PRN
Start: 2010-09-29 — End: 2010-09-29
  Administered 2010-09-29 (×2): 12.5 ug via INTRAVENOUS
  Administered 2010-09-29: 25 ug via INTRAVENOUS
  Administered 2010-09-29 (×2): 12.5 ug via INTRAVENOUS

## 2010-09-29 MED ORDER — BIVALIRUDIN 250 MG INTRAVENOUS POWDER FOR SOLUTION
1.75 mg/kg/h | INTRAVENOUS | Status: DC
Start: 2010-09-29 — End: 2010-09-30
  Administered 2010-09-29: 0 mg/kg/h via INTRAVENOUS
  Administered 2010-09-29: 1.75 mg/kg/h via INTRAVENOUS

## 2010-09-29 MED ORDER — HEPARIN (PORCINE) 1,000 UNIT/ML INJECTION SOLUTION
50.00 [IU]/kg | INTRAMUSCULAR | Status: AC
Start: 2010-09-29 — End: 2010-09-29
  Administered 2010-09-29: 3400 [IU] via INTRAVENOUS

## 2010-09-29 MED ORDER — ONDANSETRON HCL (PF) 4 MG/2 ML INJECTION SOLUTION
4.0000 mg | Freq: Three times a day (TID) | INTRAMUSCULAR | Status: DC | PRN
Start: 2010-09-29 — End: 2010-09-30
  Administered 2010-09-29: 4 mg via INTRAVENOUS
  Filled 2010-09-29: qty 2

## 2010-09-29 MED ADMIN — clopidogreL 300 mg tablet: 300 mg | ORAL | NDC 63653133202

## 2010-09-29 MED FILL — clopidogreL 300 mg tablet: 300.0000 mg | ORAL | Qty: 1 | Status: AC

## 2010-09-29 NOTE — Nurses Notes (Signed)
 Initial 4ml of air removed from TR band. No bleeding noted. CMS checks remain WNL. Will continue process as per protocol.

## 2010-09-29 NOTE — Nurses Notes (Signed)
 Report received from Clarita and Chauncey, RNs. Plan of Care Discussed during bedside report.    Pt has R radial TR band. Will remove 3 cc's of air q 15 mins starting at 1915. Will continue to monitor pt.

## 2010-09-29 NOTE — Nurses Notes (Signed)
Patient returned from cath lab s/p Cardiac cath with 4 stents placed to the RCA via right radial approach. TR band in place reported by cath lab RN to have 69ml's of air, and that TR band should not be removed for 3 hours related to hematoma that service is aware of.  Patient w/o complaints at present. Frequent vital signs and CMS checks as per order. Will continue to monitor.

## 2010-09-29 NOTE — Nurses Notes (Addendum)
Cardiac Catheterization Lab A        Heart Cath - Dr Leo Rod and Dr Elesa Massed   [name]  arrived via bed. IVF in place via PIV. Awake,reports no angina/pain/or other problems currently. States NKDA. Dr Elesa Massed speaking w her,examined,consents obtained. To Cath Lab A.  Procedure reviewed,position,questions solicited,sedation per order.  Procedure begun- Dr Leo Rod and Dr Elesa Massed.     1505 preparation for PTCA per Dr Leo Rod, Dr Ephriam Knuckles joined.    PTCA - RCA(prox+mid+distal)     Sprinter balloon, Integrity stents x4, NC balloon     1535 ACT result 429, Angiomax infusion stopped per Dr Leo Rod.     1553 PTCA stopped. TR Band prepped. Eyes open frequently,is 'alright' currently. Dr Ward speaking w her RT procedure/plan.   1603 Hematoma noted at sheath site-proximal ,examined by Ward&Moreland,  more air added to TR band and manual pressure applied by Dr Elesa Massed. Reports ' just a little' discomfort at site.

## 2010-09-29 NOTE — Nurses Notes (Signed)
77 year old female transfer from [facility] with c/o indigestion and chest tightness in the early a.m. For greater than 1 week. Previously dx with N-STEMI, presently without c/o of pain or chest tightness. See Doc flowsheet for full assessment. Pt. And family oriented to room and surroundings. Instructed to call for any concerns/needs, call bell within reach.

## 2010-09-29 NOTE — H&P (Addendum)
[facility]   Cardiology Admission  History and Physical      [name],[name], 77 y.o. female  Date of Admission:  09/29/2010  Date of Birth:  24-Jul-1918  PCP:  Ashley Bailey, MD    Information Obtained from: patient  Chief Complaint:  Atypical chest pain    HPI:  This is a 77 y/o with recent NSTEMI who presented from [facility] with atypical chest pain. The patient reports that since she was discharged on 09/21/10 she has had chest heaviness and mild associated shortness of breath while laying down at night. She reports that the pain decreases with Pepcid and nitroglycerin. She denies any exertional chest pain, shortness of breath, lightheadedness, or dizziness. At [facility], her troponin was 0.033. There were no new EKG changes noted on today's EKG. The patient is currently pain-free. She notes that she has recently been started on a lot of new medications and feels that maybe they are upsetting her stomach. No other complaints are noted.    Cardiac Risk Factors:  hypertension, MI:  NSTEMI, When? 09/2010  Past Medical History   Diagnosis Date   . PONV (postoperative nausea and vomiting)    . Hypertension    . History of transfusion 1959     no reaction to blood products   . Glaucoma      lt eye   . Arthritis    . Heartburn      occ. takes TUMS once weekly   . Colon polyp      Hx of   . Obesity    . SOB (shortness of breath) 09/13/2010   . NSTEMI (non-ST elevated myocardial infarction) 09/20/2010       Past Surgical History   Procedure Date   . Hx tonsillectomy    . Hx adenoidectomy    . Hx hysterectomy    . Hx carpal tunnel release      right   . Hx colectomy      with polyp removal   . Hx ankle fracture tx      right ankle   . Hx cataract removal      both, eyes   . Hx bladder suspension 09/18/10         Prior to Admission Medications:  Medications Prior to Admission    Medication    nitroglycerin (NITROSTAT) 0.4 mg Sublingual Tablet, Sublingual     1 Tab by Sublingual route Every 5 minutes as needed for Chest pain. for 3 doses over 15 minutes    atenolol (TENORMIN) 50 mg Oral Tablet    take 1 Tab by mouth Once a day.    Ascorbic Acid (VITAMIN [name]) 1,000 mg Oral Tablet    take 1,000 mg by mouth Once a day.    multivitamin Oral Tablet    take 1 Tab by mouth Once a day.    CA CARBONATE/VITAMIN D3/VIT K (CALCIUM CHEW ORAL)    take  by mouth.    Acetaminophen (TYLENOL) 500 mg Oral Tablet    take 500 mg by mouth Once a day.    Bimatoprost (BIMATOPROST) 0.03 % Ophthalmic Drops    by Both Eyes route every night.    dorzolamide (TRUSOPT) 2 % Ophthalmic Drops    1 Drop by Left Eye route Three times a day.    oxycodone (OXY IR) 5 mg Oral Capsule capsule    take 1 Cap by mouth Every 4 hours as needed for Pain.  Allergies   Allergen Reactions   . Phenergan (Promethazine)      Makes her "crazy"       Dye Allergy:  No  Iodine Allergy:  No    Family History  Family History   Problem Relation Age of Onset   . Stroke Father        Social History  History   Substance Use Topics   . Smoking status: Never Smoker    . Smokeless tobacco: Never Used   . Alcohol Use: Yes      infrequent          ROS: Other than ROS in the HPI, all other systems were negative.    Exam:  Temperature: 36.4 [name] (97.6 F)  Heart Rate: 77   BP (Non-Invasive): 172/72 mmHg  Respiratory Rate: 16   SpO2-1: 97 %  Pain Score: 0  General: appears in good health  Eyes: Pupils equal and round.   HENT:Head atraumatic and normocephalic  Neck: No JVD  Lungs: Clear to auscultation bilaterally.   Cardiovascular: regular rate and rhythm  systolic murmur: early systolic 1/6, crescendo and decrescendo at 2nd left intercostal space and at 2nd right intercostal space  Abdomen: Soft, non-tender, Bowel sounds normal, distended  Extremities: No cyanosis or edema  Skin: Skin warm and dry  Neurologic: Grossly normal  Lymphatics: No lymphadenopathy  Psychiatric: Normal    Labs:  Lab Results for Last 24 Hours:     Results for orders placed during the hospital encounter of 09/29/10 (from the past 24 hour(s))   CBC/DIFF   Component Value Range   . WBC 8.0  3.5 - 11.0 (THOU/uL)   . RBC 3.79 (*) 3.84 - 5.04 (MIL/uL)   . HGB 11.8  11.5 - 15.2 (g/dL)   . HCT 35.0  33.5 - 45.2 (%)   . MCV 92.1  82.0 - 99.0 (fL)   . MCH 31.0  27.4 - 33.0 (pg)   . MCHC 33.6  31.6 - 35.5 (g/dL)   . RDW 12.7  10.2 - 14.0 (%)   . PLATELET COUNT 316  140 - 450 (THOU/uL)   . MPV 7.1 (*) 7.4 - 10.4 (FL)   . PMN'S 69  40 - 75 (%)   . PMN ABS 5.480  1.5 - 7.7 (THOU/uL)   . LYMPHOCYTES 21  20 - 45 (%)   . LYMPHS ABS 1.700  1.0 - 4.8 (THOU/uL)   . MONOCYTES 9  4 - 13 (%)   . MONOS ABS 0.731  0.1 - 0.9 (THOU/uL)   . EOSINOPHIL 1  1 - 6 (%)   . EOS ABS 0.102  0.1 - 0.3 (THOU/uL)   . BASOPHILS 0  0 - 1 (%)   . BASOS ABS 0.025  0.0 - 0.2 (THOU/uL)   . NRBC'S 0  0 (/100WBC)   ELECTROLYTES   Component Value Range   . SODIUM 138  136 - 145 (mmol/L)   . POTASSIUM 4.2  3.5 - 5.1 (mmol/L)   . CHLORIDE 103  96 - 111 (mmol/L)   . CARBON DIOXIDE 26  22 - 32 (mmol/L)   . ANION GAP 9  5 - 16 (mmol/L)   BUN   Component Value Range   . BUN 12  6 - 20 (mg/dL)   . BUN/CREAT RATIO 15  6 - 22    CREATININE   Component Value Range   . CREATININE 0.81  0.49 - 1.10 (mg/dL)   . ESTIMATED GLOMERULAR  FILTRATION RATE >59  >59 (ml/min/1.33m2)   GLUCOSE, NON FASTING   Component Value Range   . GLUCOSE,NONFAST 107  65 - 139 (mg/dL)   CREATINE KINASE (CK), TOTAL   Component Value Range   . CREATINE KINASE (CK) 30  24 - 170 (U/L)   CREATINE KINASE (CK) MB ISOENZYME   Component Value Range   . CK-MB 1.3  <6.4 (ng/mL)   . MB INDEX 4.3  0.0 - 5.0 (%)   TROPONIN-I   Component Value Range   . TROPONIN-I 0.032 (*) <0.030 (ng/mL)   PT/INR   Component Value Range   . PROTHROMBIN TIME 10.3  9.1 - 11.5 (Sec)   . INR 1.0  0.8 - 1.2    PTT (PARTIAL THROMBOPLASTIN TIME)   Component Value Range   . aPTT 23.5  22.0 - 32.0 (Sec)   B-TYPE NATRIURETIC PEPTIDE   Component Value Range    . B-TYPE NATRIURETIC PEPTIDE 177 (*) <100 (pg/mL)       Imaging Studies:  CXR:  Report noted  normal lung fields, cardiomegaly  Previous echo results: 09/19/10  Impressions: The echocardiographic study was abnormal. Consistent with  coronary artery disease. Ischemic cardiomyopathy. There was no severe valvular  disease. The right ventricular systolic pressure was increased consistent with  mild pulmonary hypertension. Study Conclusions    1. Left ventricle: Systolic function was mildly to moderately reduced. The  estimated ejection fraction was likely around 35%. Hypokinesis of the  mid-distalanteroseptal myocardium. Probable dyskinesis of the apical  myocardium. Mild left ventricular diastolic dysfunction. Cannot exclude  apical thrombus.  2. Ventricular septum: Septal motion showed abnormal function and dyssynergy.  These changes are consistent with intraventricular conduction delay.  3. Left atrium: The atrium was mildly dilated.  4. Right ventricle: Systolic pressure was mildly increased. The estimated peak  pressure was 45mm Hg.    Previous ECG results:  Sinus rhythm. Left bundle branch block. Left axis deviation mild. Left atrial abnormality    Previous cath results:  Today  Previous stess results:  None on file    Patient has decision making capacity:  Yes  Advance Directive:  No Advance Directive  Code Status:  Full Code    Assessment/Plan:    1. Atypical chest pain/nausea consistent with the patient's previous symptoms of angina during her post-op NSTEMI a week or so ago:  -admit to Cardiology  -hx of recent NSTEMI  -serial cardiac enzymes  -cath today  -start on PPI    2. ICM:  -baseline EF 35%  -no prior dx of CAD  -continue atenolol    3. HTN:  -continue atenolol    DVT/PE Prophylaxis: Heparin    Teresa Pelton, DO         I saw and examined the patient.  I reviewed the resident's note.  I agree with the findings and plan of care as documented in the resident's note.  Any exceptions/additions are edited/noted.    Marny Lowenstein, MD 09/29/2010, 10:44 PM

## 2010-09-30 LAB — CBC/DIFF
BASOPHILS: 0 % (ref 0–1)
BASOS ABS: 0.022 THOU/uL (ref 0.0–0.2)
EOS ABS: 0.096 THOU/uL — ABNORMAL LOW (ref 0.1–0.3)
EOSINOPHIL: 1 % (ref 1–6)
HCT: 35.2 % (ref 33.5–45.2)
HGB: 12.2 g/dL (ref 11.5–15.2)
LYMPHOCYTES: 18 % — ABNORMAL LOW (ref 20–45)
MCH: 31.7 pg (ref 27.4–33.0)
MCHC: 34.6 g/dL (ref 31.6–35.5)
MCV: 91.5 fL (ref 82.0–99.0)
MONOCYTES: 10 % (ref 4–13)
MPV: 7.3 FL — ABNORMAL LOW (ref 7.4–10.4)
NRBC'S: 0 /100{WBCs}
PLATELET COUNT: 308 THOU/uL (ref 140–450)
PMN ABS: 8.14 THOU/uL — ABNORMAL HIGH (ref 1.5–7.7)
PMN'S: 71 % (ref 40–75)
RBC: 3.85 MIL/uL (ref 3.84–5.04)
RDW: 12.5 % (ref 10.2–14.0)

## 2010-09-30 LAB — BUN
BUN/CREAT RATIO: 14 (ref 6–22)
BUN: 12 mg/dL (ref 6–20)

## 2010-09-30 LAB — CREATINE KINASE (CK), MB FRACTION, SERUM
CK-MB: 1.4 ng/mL (ref <6.4)
CK-MB: 1.2 ng/mL (ref <6.4)
MB INDEX: 3.3 % (ref 0.0–5.0)
MB INDEX: 4.1 % (ref 0.0–5.0)

## 2010-09-30 LAB — CREATININE WITH EGFR: ESTIMATED GLOMERULAR FILTRATION RATE: >59 ml/min/1.73m2 (ref >59)

## 2010-09-30 LAB — ELECTROLYTES
ANION GAP: 8 mmol/L (ref 5–16)
CARBON DIOXIDE: 24 mmol/L (ref 22–32)
CHLORIDE: 104 mmol/L (ref 96–111)
POTASSIUM: 4.1 mmol/L (ref 3.5–5.1)
SODIUM: 136 mmol/L (ref 136–145)

## 2010-09-30 LAB — CREATINE KINASE (CK), TOTAL, SERUM OR PLASMA
CREATINE KINASE (CK): 42 U/L (ref 24–170)
CREATINE KINASE (CK): 29 U/L (ref 24–170)

## 2010-09-30 MED ORDER — LISINOPRIL 2.5 MG TABLET
2.5000 mg | ORAL_TABLET | Freq: Every day | ORAL | Status: DC
Start: 2010-09-30 — End: 2010-09-30

## 2010-09-30 MED ORDER — ATORVASTATIN 10 MG TABLET
20.00 mg | ORAL_TABLET | Freq: Every evening | ORAL | Status: DC
Start: 2010-09-30 — End: 2010-09-30
  Filled 2010-09-30: qty 2

## 2010-09-30 MED ORDER — LISINOPRIL 2.5 MG TABLET
5.0000 mg | ORAL_TABLET | Freq: Every day | ORAL | Status: DC
Start: 2010-09-30 — End: 2010-09-30

## 2010-09-30 MED ORDER — CLOPIDOGREL 75 MG TABLET
75.0000 mg | ORAL_TABLET | Freq: Every day | ORAL | Status: DC
Start: 2010-09-30 — End: 2011-09-14

## 2010-09-30 MED ORDER — LISINOPRIL 2.5 MG TABLET
5.0000 mg | ORAL_TABLET | Freq: Every day | ORAL | Status: DC
Start: 2010-09-30 — End: 2011-09-14

## 2010-09-30 MED ORDER — ATENOLOL 100 MG TABLET
100.0000 mg | ORAL_TABLET | Freq: Every day | ORAL | Status: DC
Start: 2010-09-30 — End: 2010-09-30

## 2010-09-30 MED ORDER — ISOSORBIDE MONONITRATE ER 60 MG TABLET,EXTENDED RELEASE 24 HR
60.0000 mg | ORAL_TABLET | Freq: Every day | ORAL | Status: DC
Start: 2010-09-30 — End: 2010-09-30
  Administered 2010-09-30: 60 mg via ORAL
  Filled 2010-09-30: qty 1

## 2010-09-30 MED ORDER — ATENOLOL 50 MG TABLET
50.0000 mg | ORAL_TABLET | Freq: Every day | ORAL | Status: DC
Start: 2010-10-01 — End: 2010-09-30

## 2010-09-30 MED ORDER — ASPIRIN 325 MG TABLET
325.0000 mg | ORAL_TABLET | Freq: Every day | ORAL | Status: DC
Start: 2010-09-30 — End: 2011-04-25

## 2010-09-30 MED ORDER — ATENOLOL 50 MG TABLET
100.0000 mg | ORAL_TABLET | Freq: Every day | ORAL | Status: DC
Start: 2010-10-01 — End: 2010-09-30

## 2010-09-30 MED ORDER — ATENOLOL 50 MG TABLET
50.0000 mg | ORAL_TABLET | Freq: Once | ORAL | Status: DC
Start: 2010-09-30 — End: 2010-09-30
  Filled 2010-09-30: qty 1

## 2010-09-30 MED ORDER — ATENOLOL 50 MG TABLET
100.0000 mg | ORAL_TABLET | Freq: Every day | ORAL | Status: DC
Start: 2010-09-30 — End: 2010-09-30

## 2010-09-30 MED ORDER — ATORVASTATIN 20 MG TABLET
20.00 mg | ORAL_TABLET | Freq: Every evening | ORAL | Status: DC
Start: 2010-09-30 — End: 2011-09-14

## 2010-09-30 MED ORDER — LISINOPRIL 2.5 MG TABLET
2.5000 mg | ORAL_TABLET | Freq: Every day | ORAL | Status: DC
Start: 2010-09-30 — End: 2010-09-30
  Administered 2010-09-30: 2.5 mg via ORAL
  Filled 2010-09-30: qty 1

## 2010-09-30 MED ORDER — ISOSORBIDE MONONITRATE ER 60 MG TABLET,EXTENDED RELEASE 24 HR
60.0000 mg | ORAL_TABLET | Freq: Every day | ORAL | Status: DC
Start: 2010-09-30 — End: 2010-09-30

## 2010-09-30 NOTE — Nurses Notes (Signed)
TR BAND (RADIAL COMPRESSION DEVICE) DISCHARGE INSTRUCTIONS    No lifting with affected arm for 48 hours (nothing heavier than a gallon of milk)    If bleeding or hematoma occur at the site, apply manual pressure and call physician immediately    Remove dressing the next day (09/30/10 @ 8:15 p.m) and keep site clean, dry and covered with a new band aid daily until healed    Avoid submersion of site in water for 3 days    Report any symptoms of pain, tingling, pallor, swelling, cold limb, and signs and symptoms of infection including fever over 101F to your physician.  ____________________

## 2010-09-30 NOTE — Progress Notes (Addendum)
[facility]   Cardiology   Progress Note      [name],[name]  Date of Admission:  09/29/2010  Date of service: 09/30/2010  Date of Birth:  1919/01/12  MRN:  [id number]    Hospital Day:  LOS: 1 day   Subjective: [name] denies any chest pain, nausea, shortness of breath or orthopnea.  Denies any pain, numbness, tingling in right hand.    Objective:     Vital Signs:  Temp (24hrs) Max:36.9 C (98.4 F)      Temperature: 36.9 C (98.4 F) (09/30/10 0905)  BP (Non-Invasive): 161/72 mmHg (09/30/10 0905)  MAP (Non-Invasive): 104 mmHG (09/30/10 0905)  Heart Rate: 83  (09/30/10 0905)  Respiratory Rate: 16  (09/30/10 0905)  Pain Score: 0 (09/30/10 0905)  SpO2-1: 98 % (09/30/10 0905)  Physical Exam:  General: no acute distress  Lungs: clear to auscultation bilaterally.   Cardiovascular:    Heart regular rate and rhythm with  1/6  systolic murmur  Abdomen: soft, non-tender, bowel sounds normal and non-distended  Extremities: No edema or cyanosis, small hematoma noted to the right wrist. Cap refill <3 sec, hand is warm  Neurologic: grossly normal    I/O:  I/O last 24 hours:    Intake/Output Summary (Last 24 hours) at 09/30/10 1003  Last data filed at 09/30/10 0930   Gross per 24 hour   Intake    975 ml   Output    200 ml   Net    775 ml       I/O current shift:  03/24 0800 - 03/24 1559  In: 360 [P.O.:360]  Out: -   Blood Sugars: Last Fingerstick:    Lab Results   Component Value Date    GLUCOSEPOC 130* 09/01/2010         Labs:  Lab Results for Last 24 Hours:    Results for orders placed during the hospital encounter of 09/29/10 (from the past 24 hour(s))   CBC/DIFF   Component Value Range   . WBC 8.0  3.5 - 11.0 (THOU/uL)   . RBC 3.79 (*) 3.84 - 5.04 (MIL/uL)   . HGB 11.8  11.5 - 15.2 (g/dL)   . HCT 35.0  33.5 - 45.2 (%)   . MCV 92.1  82.0 - 99.0 (fL)   . MCH 31.0  27.4 - 33.0 (pg)   . MCHC 33.6  31.6 - 35.5 (g/dL)   . RDW 12.7  10.2 - 14.0 (%)   . PLATELET COUNT 316  140 - 450 (THOU/uL)    . MPV 7.1 (*) 7.4 - 10.4 (FL)   . PMN'S 69  40 - 75 (%)   . PMN ABS 5.480  1.5 - 7.7 (THOU/uL)   . LYMPHOCYTES 21  20 - 45 (%)   . LYMPHS ABS 1.700  1.0 - 4.8 (THOU/uL)   . MONOCYTES 9  4 - 13 (%)   . MONOS ABS 0.731  0.1 - 0.9 (THOU/uL)   . EOSINOPHIL 1  1 - 6 (%)   . EOS ABS 0.102  0.1 - 0.3 (THOU/uL)   . BASOPHILS 0  0 - 1 (%)   . BASOS ABS 0.025  0.0 - 0.2 (THOU/uL)   . NRBC'S 0  0 (/100WBC)   ELECTROLYTES   Component Value Range   . SODIUM 138  136 - 145 (mmol/L)   . POTASSIUM 4.2  3.5 - 5.1 (mmol/L)   . CHLORIDE 103  96 - 111 (  mmol/L)   . CARBON DIOXIDE 26  22 - 32 (mmol/L)   . ANION GAP 9  5 - 16 (mmol/L)   BUN   Component Value Range   . BUN 12  6 - 20 (mg/dL)   . BUN/CREAT RATIO 15  6 - 22    CREATININE   Component Value Range   . CREATININE 0.81  0.49 - 1.10 (mg/dL)   . ESTIMATED GLOMERULAR FILTRATION RATE >59  >59 (ml/min/1.80m2)   GLUCOSE, NON FASTING   Component Value Range   . GLUCOSE,NONFAST 107  65 - 139 (mg/dL)   CREATINE KINASE (CK), TOTAL   Component Value Range   . CREATINE KINASE (CK) 30  24 - 170 (U/L)   CREATINE KINASE (CK) MB ISOENZYME   Component Value Range   . CK-MB 1.3  <6.4 (ng/mL)   . MB INDEX 4.3  0.0 - 5.0 (%)   TROPONIN-I   Component Value Range   . TROPONIN-I 0.032 (*) <0.030 (ng/mL)   PT/INR   Component Value Range   . PROTHROMBIN TIME 10.3  9.1 - 11.5 (Sec)   . INR 1.0  0.8 - 1.2    PTT (PARTIAL THROMBOPLASTIN TIME)   Component Value Range   . aPTT 23.5  22.0 - 32.0 (Sec)   B-TYPE NATRIURETIC PEPTIDE   Component Value Range   . B-TYPE NATRIURETIC PEPTIDE 177 (*) <100 (pg/mL)   CREATINE KINASE (CK), TOTAL   Component Value Range   . CREATINE KINASE (CK) 33  24 - 170 (U/L)   CREATINE KINASE (CK) MB ISOENZYME   Component Value Range   . CK-MB 1.5  <6.4 (ng/mL)   . MB INDEX 4.5  0.0 - 5.0 (%)   TROPONIN-I   Component Value Range   . TROPONIN-I 0.027  <0.030 (ng/mL)   CREATINE KINASE (CK), TOTAL   Component Value Range   . CREATINE KINASE (CK) 42  24 - 170 (U/L)    CREATINE KINASE (CK) MB ISOENZYME   Component Value Range   . CK-MB 1.4  <6.4 (ng/mL)   . MB INDEX 3.3  0.0 - 5.0 (%)   CBC/DIFF   Component Value Range   . WBC 11.5 (*) 3.5 - 11.0 (THOU/uL)   . RBC 3.85  3.84 - 5.04 (MIL/uL)   . HGB 12.2  11.5 - 15.2 (g/dL)   . HCT 35.2  33.5 - 45.2 (%)   . MCV 91.5  82.0 - 99.0 (fL)   . MCH 31.7  27.4 - 33.0 (pg)   . MCHC 34.6  31.6 - 35.5 (g/dL)   . RDW 12.5  10.2 - 14.0 (%)   . PLATELET COUNT 308  140 - 450 (THOU/uL)   . MPV 7.3 (*) 7.4 - 10.4 (FL)   . PMN'S 71  40 - 75 (%)   . PMN ABS 8.140 (*) 1.5 - 7.7 (THOU/uL)   . LYMPHOCYTES 18 (*) 20 - 45 (%)   . LYMPHS ABS 2.030  1.0 - 4.8 (THOU/uL)   . MONOCYTES 10  4 - 13 (%)   . MONOS ABS 1.170 (*) 0.1 - 0.9 (THOU/uL)   . EOSINOPHIL 1  1 - 6 (%)   . EOS ABS 0.096 (*) 0.1 - 0.3 (THOU/uL)   . BASOPHILS 0  0 - 1 (%)   . BASOS ABS 0.022  0.0 - 0.2 (THOU/uL)   . NRBC'S 0  0 (/100WBC)   BUN   Component Value Range   . BUN  12  6 - 20 (mg/dL)   . BUN/CREAT RATIO 14  6 - 22    CREATININE   Component Value Range   . CREATININE 0.87  0.49 - 1.10 (mg/dL)   . ESTIMATED GLOMERULAR FILTRATION RATE >59  >59 (ml/min/1.78m2)   ELECTROLYTES   Component Value Range   . SODIUM 136  136 - 145 (mmol/L)   . POTASSIUM 4.1  3.5 - 5.1 (mmol/L)   . CHLORIDE 104  96 - 111 (mmol/L)   . CARBON DIOXIDE 24  22 - 32 (mmol/L)   . ANION GAP 8  5 - 16 (mmol/L)   CREATINE KINASE (CK), TOTAL   Component Value Range   . CREATINE KINASE (CK) 29  24 - 170 (U/L)   CREATINE KINASE (CK) MB ISOENZYME   Component Value Range   . CK-MB 1.2  <6.4 (ng/mL)   . MB INDEX 4.1  0.0 - 5.0 (%)       EF:  35%  Other studies:  cardiac catheterization:  4 stents to the RCA    Current Medications:    Current facility-administered medications:  lidocaine 2% injection   Intradermal Give in Cath Lab 3rd floor   NS flush syringe  2 mL Intracatheter Q8HRS   NS flush syringe  2-6 mL Intracatheter Q1 MIN PRN   atenolol (TENORMIN) tablet  50 mg Oral Daily    Bimatoprost (LUMIGAN) 0.03 % ophthalmic solution Drop 1 Drop 1 Drop Both Eyes NIGHTLY   dorzolamide (TRUSOPT) 2 % ophthalmic solution  1 Drop Left Eye 3x/day   multivitamin tablet  1 Tab Oral Daily   nitroglycerin (NITROSTAT) sublingual tablet  0.4 mg Sublingual Q5 Min PRN   esomeprazole (NEXIUM) capsule  40 mg Oral Daily before Breakfast   bivalirudin (ANGIOMAX) 250 mg in D5W 50 mL infusion 1.75 mg/kg/hr Intravenous Cath Lab 3rd Floor Continuous   clopidogrel (PLAVIX) 75 mg tablet  75 mg Oral Daily   aspirin chewable tablet 81 mg 81 mg Oral Daily   dorzolamide (TRUSOPT) 2 % ophthalmic solution  1 Drop Left Eye 3x/day   ascorbic acid (VITAMIN C) tablet  1,000 mg Oral Daily   ondansetron (ZOFRAN) 2 mg/mL injection  4 mg Intravenous Q8H PRN   acetaminophen (TYLENOL) tablet  650 mg Oral Q4H PRN         Allergies   Allergen Reactions   . Phenergan (Promethazine)      Makes her "crazy"           Assessment/ Plan:  Active Hospital Problems   Diagnoses   . Primary Problem: Unstable angina   . HTN (hypertension)   . LBBB (left bundle branch block)       ASA:  yes  Beta Blocker:  yes  ACEI/ARB:  no  Statin:  no  Plavix: yes  Spironolactone Indicated (Heart Failure): no  DVT/PE Prophylaxis: Heparin     1. Unstable Angina:  -s/p PCI to the RCA   -serial cardiac enzymes negative post cath  -asymptomatic today  -continue on Plavix, ASA,  Atenolol, lipitor  -start lisinopril 5mg  daily  2. ICM:   -baseline EF 35%   -no prior dx of CAD   -continue atenolol, asa  -start lisinopril    3. HTN:   -continue atenolol  -start  lisinopril 5mg     Disposition Planning: Home discharge      Teresa Pelton, DO 09/30/2010, 10:03 AM        I saw and examined the patient.  I reviewed the resident's note.  I agree with the findings and plan of care as documented in the resident's note.  Any exceptions/additions are edited/noted.    Marny Lowenstein, MD 09/30/2010, 3:24 PM

## 2010-09-30 NOTE — Nurses Notes (Signed)
TR BAND (RADIAL COMPRESSION DEVICE) DISCHARGE INSTRUCTIONS   No lifting with affected arm for 48 hours (nothing heavier than a gallon of milk)   If bleeding or hematoma occur at the site, apply manual pressure and call physician immediately   Remove dressing the next day (09-30-10 @ 8:15 p.m.) and keep site clean, dry and covered with a new band aid daily until healed   Avoid submersion of site in water for 3 days   Report any symptoms of pain, tingling, pallor, swelling, cold limb, and signs and symptoms of infection including a temperature greater that 101F, to your physician   ____________________

## 2010-09-30 NOTE — Discharge Instructions (Signed)
TR BAND (RADIAL COMPRESSION DEVICE) DISCHARGE INSTRUCTIONS   No lifting with affected arm for 48 hours (nothing heavier than a gallon of milk)   If bleeding or hematoma occur at the site, apply manual pressure and call physician immediately   Remove dressing the next day (09-30-10 @ 8:15 p.m.) and keep site clean, dry and covered with a new band aid daily until healed   Avoid submersion of site in water for 3 days   Report any symptoms of pain, tingling, pallor, swelling, cold limb, and signs and symptoms of infection including a temperature greater that 101F, to your physician   ____________________

## 2010-09-30 NOTE — Nurses Notes (Signed)
Report received from [place], Night Shift RN. Plan of Care Discussed during bedside report.  VSS. Will continue to monitor.

## 2010-09-30 NOTE — Procedures (Signed)
[facility]                                    SECTION OF CARDIOLOGY                               CARDIAC DIAGNOSTIC LABORATORIES                                     [phone]    Department of Medicine      School of Medicine  Section of Cardiology      Medical Center                            CARDIAC CATHETERIZATION PROCEDURE REPORT    PATIENT NAME:  [name], [name]   HOSPITAL ZOXWRU:[id number]  DATE OF BIRTH: 1919/03/02  PROCEDURE DATE: 09/29/2010    NAME OF PROCEDURES:  1.  Percutaneous coronary intervention to the mid posterolateral branch with a 3.0 x 15-mm Integrity bare metal stent.    2.  Percutaneous coronary intervention to the mid right coronary artery with a 4.0 x 12-mm Integrity stent overlapped distally with a 3.5 x 12-mm Integrity stent.  3.  Percutaneous coronary intervention to the proximal right coronary artery with a 4.0 x 15-mm Integrity stent.  4.  Right radial artery access.    OPERATORS:  Ricky Ala MD and Donella Stade Roda-Renzelli MD.    COMPLICATIONS:   Edge dissection treated with stenting.     INDICATIONS FOR PROCEDURE:  Mrs. [name] is a 77 year old woman with no previous history of coronary artery disease who presented to the Emergency Department with symptoms of unstable angina.  She was found to have significant troponin elevation to approximately 1.5 and also a moderately-reduced left ventricular systolic function reported as 91%.  The patient was initially treated medically with heparin and Plavix therapy and antianginal therapy including beta blockers and ACE inhibitors.  However, the patient continued to have anginal symptoms throughout her hospitalization and the patient elected for cardiac catheterization after risks and benefits were discussed.  For further details of the patient's past medical history, please refer to Dr. Willadean Carol dictation for diagnostic angiography which included left heart catheterization and selective coronary angiography.  Ultimately, the patient underwent cardiac catheterization via 5-French hydrophilic sheath in the right radial artery.  She was found to have critical stenoses of the right coronary artery and also significant stenosis of the left anterior descending artery  but it appeared that the right coronary artery was the culprit lesion for non-ST elevation myocardial infarction.  We proceeded on to intervention to the right coronary artery.    DESCRIPTION OF PROCEDURE:       PERCUTANEOUS CORONARY INTERVENTION TO THE RIGHT CORONARY ARTERY:  We selected a 5-French Amplatz left 0.75 guide and advanced this under direct fluoroscopic guidance to the ostium of the right coronary artery.  It initially went into the proximal stenosis and we did have some ST segment elevation and hypotension.  We therefore repositioned the guide.  We then advanced a Runthrough wire into the distal posterolateral branch.  We used Angiomax for anticoagulation and monitored ACTs during the case.  We initially took a 3.0 x 12-mm Sprinter balloon and  did 2 inflations within the posterolateral branch lesion of 4 and 6 atmospheres.  We then dilated the mid RCA at 8 and 10 atmospheres and then 1 inflation in the proximal right coronary artery at 12 atmospheres for 20 seconds.  At this point, the guide came back into the ascending aorta and we had difficulty in repositioning the guide in the ostium of the right coronary artery.  We therefore placed a 3.0 x 12-mm Sprinter balloon in the ostium of the proximal right coronary artery and inflated the balloon to 6 atmospheres to reposition the guide.  The guide still sat in the critical stenosis in the proximal portion, so we decided to stent the proximal right coronary artery first because we did have complication of hypotension and ST segment elevation.  We therefore took a 4.0 x 15-mm Integrity stent and deployed this in the proximal right coronary artery at 12 atmospheres for 30 seconds with good results and also no more hypotension and also ST segment resolution.  We then took a 3.0 x 15-mm Integrity stent, took this into the mid posterolateral branch of the distal right coronary artery and deployed at 12 atmospheres for 25 seconds with good results.  We then took a 4.0 x 12-mm Integrity stent, used this to spot stent the mid lesion at 12 atmospheres for 30 seconds.  We took a 3.5 x 8-mm noncompliant Quantum balloon and postdilated the mid portion of the proximal stent  and did 2 inflations at 22 atmospheres each for 20 seconds each.  Diagnostic angiography after postdilatation of the proximal stent did reveal a dissection of the mid right coronary artery just distal to the mid placed stent.  We therefore took a 3.5 x 12-mm Integrity stent overlapped distally to the mid stent and deployed it at 12 atmospheres for 25 seconds.  This successfully treated a distal edge dissection.  We then reinflated the stent balloon at 15 atmospheres at the overlap site.  At the end of the case, there was TIMI-3 flow, the dissection had resolved and there was less than 0% residual within the stented portions.    FINDINGS:    CORONARY ANGIOGRAPHY:    LEFT MAIN CORONARY ARTERY:  Was heavily calcified and was normal.    LEFT ANTERIOR DESCENDING CORONARY ARTERY:  Was  heavily calcified in the proximal mid portion with luminal irregularities and severe mid lesion where the vessel tapers in size.  The diagonal #1 and diagonal #3 were fairly small and normal.    LEFT CIRCUMFLEX CORONARY ARTERY:  Was nondominant, 30% ostial stenosis, small after the obtuse marginal branch #3.  The obtuse marginal branch  #1 to # 3 were all small to moderate size with luminal irregularities.    RIGHT CORONARY ARTERY:  Was large, dominant vessel was calcified.  There was an 80% ulcerative proximal plaque and then a 70% mid stenosis.  There was an 80% mid posterolateral branch stenosis.  The posterior descending artery had a fairly high takeoff and was normal.    IMPRESSION:  1.  Severe mid left anterior descending artery stenosis with a known baseline wall motion abnormality consistent with prior anterior wall myocardial infarction on echocardiogram.  2.  Severe right coronary artery and posterolateral branch stenosis, status post successful percutaneous coronary intervention with bare metal stent x4.    RECOMMENDATIONS:  1.  Aspirin and Plavix therapy.   2.  Risk factor modification and medical therapy for coronary artery disease.  3.  If symptoms continue, will consider  PCI to LAD.  4.  Follow up with Dr. Leo Rod in [place] as scheduled.      Daria Pastures, MD  Fellow  Interventional Cardiology  [place] Department of Medicine    Ricky Ala, MD  Assistant Professor, Section of Cardiology  [facility] Department of Medicine    [id number]; D: 09/29/2010 17:03:41; T: 09/30/2010 15:40:37    See fellow's note for details. I saw and evaluated the patient.  I agree with the fellow's description of the procedure, findings, impressions, and recommendations. I was present for the surgical procedure and otherwise immediately available for the duration of the procedure.  I have reviewed the images and confirmed or revised the interpretation as documented by the fellow.  Any exceptions are noted. I was present and supervised all procedures performed and participated in all critical aspects of these procedures.      Marny Lowenstein, MD         cc: Unice Bailey MD      [address]       [place], [place] [address]

## 2010-09-30 NOTE — Nurses Notes (Signed)
 Pt discharge orders in, s/p cardiac catheterization with stent placements.  Written and verbal instructions given regarding return to clinic appointments, home medications, wound care, signs and symptoms of infection and activity restrictions.  Prescriptions and stent cards given to pt with explanation and handouts. Pt and daughter verbalize understanding of all handouts and instructions.  Pt left floor with all personal belongings via wheelchair with SA and family.

## 2010-09-30 NOTE — Discharge Summary (Addendum)
[facility]  DISCHARGE SUMMARY      PATIENT NAMESherma, [name]  MRN:  [id number]  DOB:  [birth year]    ADMISSION DATE:  09/29/2010  DISCHARGE DATE:  09/30/2010    ATTENDING PHYSICIAN: Marny Lowenstein, MD  PRIMARY CARE PHYSICIAN: Unice Bailey, MD     ADMISSION DIAGNOSIS: Unstable angina  DISCHARGE DIAGNOSIS:   No resolved problems to display.    Active Hospital Problems   Diagnoses Date Noted    . Principle Problem: Unstable angina 09/30/2010    . HTN (hypertension) 09/13/2010    . LBBB (left bundle branch block) 09/13/2010       Resolved Hospital Problems   Diagnoses       Active Non-Hospital Problems   Diagnoses Date Noted   . Arthritis    . Heartburn    . Colon polyp    . Obesity    . NSTEMI (non-ST elevated myocardial infarction) 09/20/2010   . Pre-op evaluation 09/14/2010   . SOB (shortness of breath) 09/13/2010   . SOB (shortness of breath) 09/13/2010        DISCHARGE MEDICATIONS:  Current Discharge Medication List      START taking these medications       clopidogrel (PLAVIX) 75 mg Oral Tablet    take 1 Tab by mouth Once a day.    Qty: 30 Tab Refills: 3        aspirin 325 mg Oral Tablet    take 1 Tab by mouth Once a day.    Qty: 30 Tab Refills: 3        atorvastatin (LIPITOR) 20 mg Oral Tablet    take 1 Tab by mouth QPM.    Qty: 30 Tab Refills: 3        lisinopril (PRINIVIL) 2.5 mg Oral Tablet    take 2 Tabs by mouth Once a day.    Qty: 60 Tab Refills: 3          CONTINUE these medications which have NOT CHANGED       nitroglycerin (NITROSTAT) 0.4 mg Sublingual Tablet, Sublingual    1 Tab by Sublingual route Every 5 minutes as needed for Chest pain. for 3 doses over 15 minutes    Qty: 20 Tab Refills: 12        atenolol (TENORMIN) 50 mg Oral Tablet    take 1 Tab by mouth Once a day.    Qty: 30 Tab Refills: 11        Ascorbic Acid (VITAMIN C) 1,000 mg Oral Tablet    take 1,000 mg by mouth Once a day.        multivitamin Oral Tablet     take 1 Tab by mouth Once a day.        CA CARBONATE/VITAMIN D3/VIT K (CALCIUM CHEW ORAL)    take  by mouth.        Acetaminophen (TYLENOL) 500 mg Oral Tablet    take 500 mg by mouth Once a day.        Bimatoprost (BIMATOPROST) 0.03 % Ophthalmic Drops    by Both Eyes route every night.        dorzolamide (TRUSOPT) 2 % Ophthalmic Drops    1 Drop by Left Eye route Three times a day.          STOP taking these medications       oxycodone (OXY IR) 5 mg Oral Capsule capsule    Comments:     Reason  for Stopping:             DISCHARGE INSTRUCTIONS:     SCHEDULE FOLLOW-UP - CARDIOLOGY - [facility] - [facility]   Reason for visit: HOSPITAL DISCHARGE    Followup reason: Post PCI    Follow-up in: 4 WEEKS        REASON FOR HOSPITALIZATION AND HOSPITAL COURSE:  This is a 77 y.o., female with recent NSTEMI who presented from [facility] with chest pain similar to her prior anginal pain. The patient reports that since she was discharged on 09/21/10 she has had chest heaviness and mild associated shortness of breath while laying down at night. She reports that the pain decreases with Pepcid and nitroglycerin. She denies any exertional chest pain, shortness of breath, lightheadedness, or dizziness. Cardiac enzymes were negative x sets.  She was taken to the cath lab and received 4 DES to the RCA.  Post operatively, her cardiac enzymes remained stable and she was asymptomatic.  She did develop a hematoma at the site of her catheterization sheath post procedurally, but perfusion to her right had remained intact.  The hematoma was small, nontense at discharge.  She was advised to follow up with Dr. Leo Rod in 4 weeks.    CONDITION ON DISCHARGE:  A. Ambulation: independently  B. Self-care Ability: independently  C. Cognitive Status A&O x 3    DISCHARGE DISPOSITION:  Home discharge     cc: Primary Care Physician:  Unice Bailey, MD  [address]  [place] [address]     VH:[id number] Physician:  Vanetta Shawl, MD  [address]   [place], [place] [address]     Teresa Pelton, DO

## 2010-10-07 NOTE — Procedures (Signed)
[facility]                                    SECTION OF CARDIOLOGY                               CARDIAC DIAGNOSTIC LABORATORIES                                     ([phone]    Department of Medicine      School of Medicine  Section of Cardiology      Medical Center                            CARDIAC CATHETERIZATION PROCEDURE REPORT    PATIENT NAME:  [name], [name]   HOSPITAL ZOXWRU:[id number]  DATE OF BIRTH: [birth year]  PROCEDURE DATE: 09/29/2010    PRIMARY CARDIOLOGIST:  Ricky Ala MD.    NAME OF PROCEDURES:  1.  Left heart catheterization with left ventricular function study.  2.  Selective coronary angiography  3.  Right radial artery access.    OPERATORS:  Ricky Ala MD, Elam Dutch MD.    COMPLICATIONS:  None.    BLOOD LOSS:  None.    INDICATIONS FOR PROCEDURES:  The patient is a 77 year old female with no previous history of coronary artery disease.  She recently was evaluated for preoperative risk before undergoing recent surgery.  She was found to have an area of scar in the apex in the distal anteroseptal wall, consistent with mid LAD lesion, and the patient was asymptomatic.  The patient was cleared for surgery and deemed to be high-risk, but did proceed with her surgery.  She did have a small non-ST elevation MI in the perioperative period.  She now presents to [place] for cardiac catheterization to further evaluate the cause for non-ST elevation MI.     DESCRIPTION OF PROCEDURE:  After informed consent was obtained, the patient was brought to the cardiac catheterization laboratory where she was prepped and draped in usual sterile fashion.  Incremental doses of Versed and fentanyl were given to achieve an adequate level of sedation.  Approximately 1 mL of 2% lidocaine was infiltrated in the right radial area.  Using the true Seldinger technique, a 5-French sheath was advanced to the right radial artery.  Under direct fluoroscopic guidance, a 5-French Tiger catheter was taken to the level of the left ventricle.  Left ventricular pressure was obtained and upon pullback across the aortic valve.  This catheter was used to engage the left main coronary.  Its anatomy was defined in multiple views.  This catheter was then used to engage the right coronary.  Its anatomy was defined in multiple views.  At this point, the images were reviewed and the decision was made to proceed with intervention to the right coronary artery.  This was dictated by Dr. Jodi Geralds on the same date.  Please see his dictation for details.  Total contrast used for both the diagnostic and interventional procedures was 155 mL of Optiray.  Total fluoroscopy time for both procedures was 39 minutes.    FINDINGS:    HEMODYNAMICS:  There was no significant mean aortic valve gradient.  LVEDP was estimated at  10 mmHg.    LEFT MAIN CORONARY ARTERY:  Was heavily calcified vessel that was angiographically normal.  This vessel was heavily calcified in the proximal-to-mid portion, with only luminal irregularities, but then there was a severe mid vessel stenosis where the vessel tapered in size.  There were 3 diagonal branches that were fairly small in size and angiographically normal.     LEFT CIRCUMFLEX CORONARY ARTERY:  Was a nondominant vessel.  There was 30.  There was 30% ostial stenosis.  The vessel became small after the takeoff of the third OM branch.  There were 3 obtuse marginal branches that were all small-to-moderate in size with luminal irregularities only.    RIGHT CORONARY ARTERY:  Was a large, dominant vessel.  There was an 80% ulcerative plaque in the proximal portion.  Within the mid portion, there was a 70% stenosis.  There was a 30% area of stenosis in the mid-to-distal portion.  In the PLB, there was an 80% mid stenosis.  PDA had a fairly high takeoff and was angiographically normal.    IMPRESSION:  1.  Severe mid left anterior descending coronary artery stenosis with known baseline wall motion abnormality wall motion abnormality consistent with prior anterior wall myocardial infarction on echocardiogram.  2.  Severe right coronary artery and posterolateral branch stenoses.    RECOMMENDATIONS:    1.  The patient underwent intervention to the right coronary artery and the posterolateral branch with bare metal stent placement x4.  Please see dictation by Dr. Jodi Geralds for these details.  The patient will need to continue aspirin indefinitely and Plavix for at least 1 month, but ideally up to 1 year.  2.  Risk factor modification and medical therapy for coronary artery disease.  3.  Follow up with Dr. Leo Rod in [place] as scheduled.      Elam Dutch, MD  Fellow, Section of Cardiology  [place] [facility] of Medicine    Ricky Ala, MD  Assistant Professor, Section of Cardiology  [facility] Department of Medicine    [id number]; D: 10/06/2010 17:54:58; T: 10/07/2010 17:12:51     See fellow's note for details. I saw and evaluated the patient.  I agree with the fellow's description of the procedure, findings, impressions, and recommendations. I was present for the surgical procedure and otherwise immediately available for the duration of the procedure.  I have reviewed the images and confirmed or revised the interpretation as documented by the fellow.  Any exceptions are noted. I was present and supervised all procedures performed and participated in all critical aspects of these procedures.      Marny Lowenstein, MD

## 2010-10-12 ENCOUNTER — Encounter (HOSPITAL_BASED_OUTPATIENT_CLINIC_OR_DEPARTMENT_OTHER): Payer: Self-pay | Admitting: Hospital-Specialty Hospital

## 2010-10-12 NOTE — Progress Notes (Signed)
[facility]   Department of Obstetrics & Gynecology   Division of Urogynecology & Reconstructive Pelvic Surgery     Outpatient Progress Note    Name: [name]  MRN: [id number]   DOB: 1919-02-18   Date: 10/12/2010     Called [name] for follow-up.  Doing very well.  All her cardiac symptoms resolved after stents were placed.      Lavere Stork HM Benay Pillow, MD

## 2010-10-19 ENCOUNTER — Encounter (HOSPITAL_COMMUNITY): Payer: Self-pay | Admitting: Interventional Cardiology

## 2010-10-24 ENCOUNTER — Ambulatory Visit (HOSPITAL_BASED_OUTPATIENT_CLINIC_OR_DEPARTMENT_OTHER): Payer: Self-pay | Admitting: Hospital-Specialty Hospital

## 2010-10-25 ENCOUNTER — Encounter (INDEPENDENT_AMBULATORY_CARE_PROVIDER_SITE_OTHER): Payer: Self-pay | Admitting: Interventional Cardiology

## 2010-10-25 ENCOUNTER — Ambulatory Visit (INDEPENDENT_AMBULATORY_CARE_PROVIDER_SITE_OTHER): Payer: Medicare Other | Admitting: Interventional Cardiology

## 2010-10-25 DIAGNOSIS — I2510 Atherosclerotic heart disease of native coronary artery without angina pectoris: Secondary | ICD-10-CM

## 2010-10-25 HISTORY — DX: Atherosclerotic heart disease of native coronary artery without angina pectoris: I25.10

## 2010-10-25 NOTE — Progress Notes (Signed)
 PCP: Marty Bills, MD     Subjective:  [name] is a 77 y.o. year old female who presents for No chief complaint on file.   to clinic after undergoing uro-gynecologic surgery (09/18/2010) she suffered a postoperative NSTEMI, peak troponin was 1.5.     She was discharged home to follow up, but presented to [facility] with CP - was transferred to [facility] and underwent [facility] 09/29/2010 with placement of BMS x 4 the RCA system.           TTE (EF same as pre-MI)  1. Left ventricle: Systolic function was mildly to moderately reduced. The  estimated ejection fraction was likely around 35%. Hypokinesis of the  mid-distalanteroseptal myocardium. Probable dyskinesis of the apical  myocardium. Mild left ventricular diastolic dysfunction. Cannot exclude  apical thrombus.  2. Ventricular septum: Septal motion showed abnormal function and dyssynergy.  These changes are consistent with intraventricular conduction delay.  3. Left atrium: The atrium was mildly dilated.  4. Right ventricle: Systolic pressure was mildly increased. The estimated peak  pressure was 45mm Hg.    09/29/2010 [facility]  NAME OF PROCEDURES:   Percutaneous coronary intervention to the mid posterolateral branch with a 3.0 x 15-mm Integrity bare metal stent. Percutaneous coronary intervention to the mid right coronary artery with a 4.0 x 12-mm Integrity stent overlapped distally with a 3.5 x 12-mm Integrity stent. Percutaneous coronary intervention to the proximal right coronary artery with a 4.0 x 15-mm Integrity stent. Right radial artery access.    Current outpatient prescriptions   Medication Sig   . clopidogrel  (PLAVIX ) 75 mg Oral Tablet take 1 Tab by mouth Once a day.   . aspirin  325 mg Oral Tablet take 1 Tab by mouth Once a day.   . atorvastatin  (LIPITOR ) 20 mg Oral Tablet take 1 Tab by mouth QPM.   . lisinopril  (PRINIVIL ) 2.5 mg Oral Tablet take 2 Tabs by mouth Once a day.   . nitroglycerin  (NITROSTAT ) 0.4 mg Sublingual Tablet, Sublingual 1 Tab by Sublingual route  Every 5 minutes as needed for Chest pain. for 3 doses over 15 minutes   . atenolol  (TENORMIN ) 50 mg Oral Tablet take 1 Tab by mouth Once a day.   . Ascorbic Acid  (VITAMIN C ) 1,000 mg Oral Tablet take 1,000 mg by mouth Once a day.   . multivitamin Oral Tablet take 1 Tab by mouth Once a day.   . CA CARBONATE/VITAMIN D3/VIT K (CALCIUM  CHEW ORAL) take  by mouth.   . Acetaminophen  (TYLENOL ) 500 mg Oral Tablet take 500 mg by mouth Once a day.   . Bimatoprost  (BIMATOPROST ) 0.03 % Ophthalmic Drops by Both Eyes route every night.   . dorzolamide  (TRUSOPT ) 2 % Ophthalmic Drops 1 Drop by Left Eye route Three times a day.          Allergies   Allergen Reactions   . Phenergan (Promethazine)      Makes her crazy         Objective:  There were no vitals taken for this visit.  There is no height or weight on file to calculate BMI.   General: No distress, appears age  Neck: No bruit heard  Lungs: CTA B.  Cardiovascular:    RRR, no murmur  Chest wall: Non tender  Extremities: No edema  Skin: No rashes  Neurologic: Alert, oriented  Psychiatric: Normal affect    Lab Results   Component Value Date    TRIG 406* 09/20/2010    HDLCHOL  27* 09/20/2010    LDLCHOL  Value: CALCULATION FOR LDL/VLDL-CHOLESTEROL IS INACCURATE WHEN  TRIGLYCERIDE RESULT IS GREATER THAN 400 mg/dl. 09/20/2010    CHOLESTEROL 121 09/20/2010        Results for Malecha, Pinky C (MRN [id number]) as of 10/25/2010 11:43   Ref. Range 09/20/2010 06:00   LDL CHOLESTEROL,DIRECT Latest Range: <100 mg/dL 49     No results found for this basename: ast,  alt           Assessment & Plan:  1. Coronary artery disease.  Continue asa, plavix , BB, statin.  No symptoms suggestive of angina.    2. Hypertension.  Stable. Continue antihypertensives.    3. Hyperlipidemia. LDL is well controlled.  Continue statin therapy and dietary measures.    4.  Ischemic cardiomyopathy.  No CHF symptoms.  Cont current meds.    Goals include BP < 130/80, LDL< 100 preferably < 70, HDL > 40, TG < 150, and Hba1c <  7.     Return to Clinic in 6 months or sooner if needed.    No orders of the defined types were placed in this encounter.       Khrystyne Arpin A Katona, PA-C 10/25/2010, 11:49 AM   Namon Villarin Katona, PA-C  Physician Assistant - Certified  [facility]     Selinda Corolla, M.D.  Assistant Professor of Medicine  [employer]      I have seen and evaluated the patient jointly with Montae Stager Katona PA-C and agree with her assessment, recommendations and plans.    Selinda DELENA Corolla, MD

## 2010-10-31 ENCOUNTER — Ambulatory Visit: Payer: Medicare Other | Attending: Hospital-Specialty Hospital | Admitting: Hospital-Specialty Hospital

## 2010-10-31 DIAGNOSIS — Z09 Encounter for follow-up examination after completed treatment for conditions other than malignant neoplasm: Secondary | ICD-10-CM | POA: Insufficient documentation

## 2010-10-31 NOTE — Progress Notes (Signed)
[facility]   Department of Obstetrics & Gynecology   Division of Urogynecology & Reconstructive Pelvic Surgery     Outpatient Progress Note    Name: NYEISHA GOODALL  MRN: [id number]   DOB: 1919-02-09   Date: 10/31/2010       Here for postoperative check.  Doing very well and has no complaints.  All urinary and pelvic symptoms have resolved.  Has been able to resume normal function after stent placement.  Living alone again.    PE - healthy female.   BP -  136/72  Abdomen - soft and NT. SP incisions - clean and intact.  Speculum - vagina is very well supported.    Assessment: doing well.      Plan: Precaution to prevent prolapse discussed.  : Follow-up PRN.  : Call with any problem.       Enes Rokosz HM Benay Pillow, MD

## 2010-10-31 NOTE — Progress Notes (Signed)
 Do not send a copy of this letter to the referring provider.   Copy on departmental stationary sent.     October 31, 2010     Stacy Hamilton, MD   [address]   [place], [place]  [address]    RE: [name]   DOB: [birth year]     Dear Dr. Hamilton:      [name] returned for her postoperative check this morning. She underwent anterior, posterior and enterocele repair; uterosacral ligament vaginal vault suspension, vaginectomy, and placement of tension-free vaginal tape on 07/20/10. Her surgery went well.  However, [name] suffered a mild MI postoperatively.  Her chest pressure and pain were relieved after 4 stents were placed in her right coronary artery. Since then, [name] has done very well.  All her pelvic and urinary symptoms have resolved. She is living by herself again.  Examination showed that her vagina is very well supported.  I have asked [name] to contact your office for follow-up and will be happy to see her again as needed.   Thank you again for asking me to see this very pleasant lady.       Respectfully,         Lytle AMADEO Days, MD

## 2010-12-21 ENCOUNTER — Encounter (INDEPENDENT_AMBULATORY_CARE_PROVIDER_SITE_OTHER): Payer: Self-pay | Admitting: Interventional Cardiology

## 2010-12-21 DIAGNOSIS — E785 Hyperlipidemia, unspecified: Secondary | ICD-10-CM

## 2010-12-21 HISTORY — DX: Hyperlipidemia, unspecified: E78.5

## 2010-12-21 NOTE — Progress Notes (Addendum)
OV dictated    Kaci Freel L. Rexanne Inocencio, PA-C

## 2010-12-22 NOTE — Progress Notes (Signed)
[facility]  [address]  [place], [place] [address]    PROGRESS NOTE    PATIENT NAME: [name], [name]  CHART NUMBER: [id number]  DATE OF BIRTH: 1919/01/16  DATE OF SERVICE: 10/31/2010    SUBJECTIVE:  A 77 year old white female who was initially evaluated for surgical clearance, which was deemed moderate to high cardiac risk secondary to a left bundle branch block, shortness of breath and hypertension and the likelihood of underlying coronary artery disease.  In her workup, her EF was found to be 30-40%.  She had probable ischemic cardiomyopathy with septum akinetic and apex appeared hypokinetic in some views.  She elected to proceed with her surgery, which was bladder repair, and subsequently suffered postoperatively with a non-ST elevated MI with troponin peaking at 1.5.  She underwent heart catheterization and had 4 bare metal stents to her RCA system.  She also had severe mid left anterior descending artery stenosis with a known baseline wall motion abnormality consistent with prior anterior wall MI by previous stated echo.  Since her return to home, she initially had some mild epigastric as well as substernal chest pain that she described as heaviness during the night, which was relieved with nitroglycerin sublingual x1.  However, she had had no recurrence of chest pain for at least 1 week.  She has been without evidence of heart failure, no shortness of breath at rest or edema.  She has complained of some mild dyspnea upon exertion, however, feels that if she paces her activities she is still able to complete all tasks that she wishes to pursue.    ALLERGIES:  PHENERGAN.    MEDICATIONS:  1.  Vitamin C 1000 mg one p.o. q. day.  2.  Aspirin 325 mg one p.o. q. day.  3.  Tenormin 50 mg one p.o. day.  4.  Lipitor 20 mg one p.o. q. day.  5.  Bimatoprost 0.03% ophthalmic drops to both eyes q. day.  6.  Calcium 1 p.o. q. day.  7.  Plavix 75 mg p.o. q. day.  8.  Trusopt 2% one drop left eye t.i.d.   9.  Prinivil 2.5 mg two tablets p.o. q. day.  10.  Multivitamin 1 tablet p.o. q. day.    REVIEW OF SYSTEMS:  She denies either fever or chills.  No headache, syncope or near syncopal event.  Previous chest pain as noted per HPI.  No shortness of breath at rest; however, some mild dyspnea upon exertion, again per HPI.  No orthopnea or PND.  No palpitations.  No cough or hemoptysis.  No abdominal pain, nausea, vomiting, diarrhea or change in either bowel or bladder habits.  No melena or tarry stool.  No edema nor known weight change.    OBJECTIVE:  On physical exam, blood pressure is 118/74, pulse is 72 and regular, respirations 20, even and nonlabored.  Spo2 is 97% on room air.  Height is 5 feet 4 inches, weight 152 pounds.  On general exam, she is alert and oriented x3.  HEENT is normocephalic, atraumatic.  Sclerae is clear, nonicteric.  Neck is supple without adenopathy, JVD or bruit.  There is no thyromegaly.  Lungs are clear to auscultation with equal air entry bilaterally.  Heart is regular rate with a I/VI systolic ejection murmur left sternal border without gallop or rub.  Abdomen is soft, nontender, nondistended without hepatosplenomegaly or palpable mass.  There are positive bowel sounds throughout.  Extremities are without clubbing, cyanosis or edema as well as  symmetrical pulses.    ASSESSMENT:  1.  Recent non-ST elevated MI with intervention including 4 drug-eluting stents to her RCA.  2.  Severe mid LAD stenosis with a known baseline of wall motion abnormality.  3.  Decreased left ventricular systolic function with an EF of 30%.  4.  Hypertension.  5.  Hyperlipidemia.    PLAN:  1.  We will decrease her aspirin to 81 mg p.o. q day.  2.  We will continue otherwise on the same medications as listed.  3.  We will see her back in the clinic in 6 months, sooner if needed.      Gweneth Fritter, PA-C  Section of Cardiology  [facility]    Ricky Ala, MD  Assistant Professor   [facility] Department of Cardiology    [id number]; D: 12/21/2010 11:49:32; T: 12/22/2010 13:37:21      I have seen and evaluated the patient jointly with Herbie Saxon PA-C and agree with her assessment, recommendations and plans.      Marny Lowenstein, MD         cc: Unice Bailey MD      [address]       [place], [place] [address]

## 2011-04-25 ENCOUNTER — Ambulatory Visit (INDEPENDENT_AMBULATORY_CARE_PROVIDER_SITE_OTHER): Payer: Medicare Other | Admitting: Interventional Cardiology

## 2011-04-25 DIAGNOSIS — I447 Left bundle-branch block, unspecified: Secondary | ICD-10-CM

## 2011-04-25 DIAGNOSIS — I10 Essential (primary) hypertension: Secondary | ICD-10-CM

## 2011-04-25 DIAGNOSIS — E785 Hyperlipidemia, unspecified: Secondary | ICD-10-CM

## 2011-04-25 DIAGNOSIS — I2510 Atherosclerotic heart disease of native coronary artery without angina pectoris: Secondary | ICD-10-CM

## 2011-04-25 DIAGNOSIS — I255 Ischemic cardiomyopathy: Secondary | ICD-10-CM

## 2011-05-16 NOTE — Progress Notes (Addendum)
OV dictated    Kemiyah Tarazon L. Lockie Bothun, PA-C

## 2011-05-17 NOTE — Progress Notes (Addendum)
[facility]  [address] [place], [place] [address]    PROGRESS NOTE    PATIENT NAME: [name], [name]  CHART NUMBER: [id number]  DATE OF BIRTH: 1918-08-01  DATE OF SERVICE: 04/25/2011    [facility]  [address]  [place], [place]  [address]     CARDIAC HISTORY:  The patient was initially evaluated on 09/13/10 for preoperative assessment prior to a fairly low risk noncardiac surgery. A transthoracic echocardiogram was performed and [facility] shortly after that clinic visit. This echocardiogram showed a mildly reduced ejection fraction with wall motion abnormalities involving the anteroseptal wall and apex. Because the patient was fairly asymptomatic and the surgery was a low-risk procedure, it was recommended she proceed with her surgery. She did have complaints of chest pain following the surgery and was diagnosed with a non-ST segment elevation acute myocardial infarction. A transthoracic echocardiogram on 09/19/10 showed an ejection fraction of 35% with hypokinesia involving the mid to distal anteroseptal wall. The apex appears dyskinetic. There was septal dyssynergy. There is mild diastolic dysfunction mildly dilated left atrium. The right ventricular systolic pressure was mildly elevated at 45 mmHg consistent with mild pulmonary hypertension. She was started on aggressive medical therapy with resolution of her symptoms. Shortly after her hospital discharge, her symptoms returned and continued despite escalating doses of antianginal medications. The patient was brought to the cardiac catheterization laboratory on 09/29/10. Mid left anterior descending coronary artery contains a severe lesion, but the vessel tapered in size distal to the lesion. Based on the echocardiogram findings, this was felt to represent an old myocardial infarction with spontaneous recannulization of the vessel. There were multiple areas of severe disease within the right coronary system. An 80% lesion in the mid posterior lateral branch was treated with a 3.0 x 15 mm Integrity stent. A 70% lesion in the mid right coronary artery was treated with 4.0 x 12 mm integrity stent. This was overlapped distally with a 3.5 x 12 mm Integrity stent to treat a distal edge dissection.  The proximal right coronary artery contained an 80% ulcerated plaque which was felt to be the cause of the patient's non-ST segment elevation acute myocardial infarction. This was treated with a 4.0 x 15 mm Integrity stent.    SUBJECTIVE:  This is a 77 year old white female who initially underwent evaluation for surgical clearance and was deemed to be moderate to high risk secondary to a screening EKG that showed a left bundle branch block as well as a history of shortness of breath and uncontrolled hypertension.  In her workup, a 2-D echo revealed an EF of 30-40% as well as akinetic septum and apex.  She elected to proceed with bladder repair.  She subsequently suffered postoperatively with a non-ST elevated MI with a peak troponin of 1.5.  She did undergo heart catheterization and had 4 bare metal stents to her RCA.  She also had severe mid left anterior descending artery stenosis with a known baseline wall motion abnormality consistent with prior anterior wall MI by previous stated echo.  She also has risk factors as stated with her hypertension as well as hyperlipidemia.    In the office today, she denies any complaints.  She has been without chest pain or shortness of breath at rest.  She has had some mild dyspnea upon exertion; however, this is chronic, without change or exacerbation.  She feels that with pacing she is able to complete all tasks that she  wishes to pursue.    ALLERGIES:  PHENERGAN.    MEDICATIONS:  1.  Vitamin C 1000 mg p.o. daily.  2.  Aspirin 81 mg p.o. daily.  3.  Tenormin 50 mg 1 p.o. daily.  4.  Lipitor 20 mg p.o. daily.  5.  Bimatoprost 0.03% eyedrops.  6.  Vitamin D3.  7.  Vitamin K.  8.  Calcium 2 p.o. daily.  9.  Plavix 75 mg 1 p.o. daily.  10.  Trusopt 2% one drop left eye t.i.d.  11.  Prinivil 2.5 mg 2 tablets p.o. daily.  12.  Multivitamin 1 p.o. daily.  13.  Omega 3 fatty acid 1 p.o. daily.     REVIEW OF SYSTEMS:  Denies either fever or chills.  No headache, syncope or near syncopal event.  Denies chest pain or shortness of breath at rest.  She does have chronic dyspnea upon exertion without change or exacerbation.  No orthopnea or PND.  No palpitations.  No cough or hemoptysis.  No abdominal pain, nausea, vomiting, diarrhea or change in either bowel or bladder habits.  No melena or tarry stool.  No edema nor known weight change.    OBJECTIVE:  On physical exam, blood pressure is 112/62, pulse is 74 and regular, respirations 20, even and nonlabored.  SpO2 is 96% on room air.  Height is 5 feet 4 inches, weight 153 pounds.  On general exam, she is alert and oriented x3.  HEENT is normocephalic, atraumatic.  Sclerae are clear, nonicteric.  Neck is supple without adenopathy, JVD or bruit.  There is no thyromegaly.  Lungs are clear to auscultation with equal air entry bilaterally.  Heart is regular rate with a 1/6 systolic ejection murmur heard best at left sternal border.  No gallop or rub.  Abdomen is soft, nontender, nondistended without hepatosplenomegaly or palpable mass.  There are positive bowel sounds throughout.  Extremities are without clubbing, cyanosis or edema as well as symmetrical pulses.    ASSESSMENT:  1.  Non-ST elevated MI with 4 drug-eluting stents to her RCA.  2.  Severe mid LAD stenosis with known baseline of wall motion abnormality.  3.  Decreased left ventricular systolic function with an EF of 30%.  4.  Hypertension.  5.  Hyperlipidemia.    PLAN:  1.  We will continue present medications today without change including Plavix, aspirin, beta blockade.  2.  Risk factor modification including lipid management by primary care physician with LDL optimal goal less than 70, triglycerides less than 150 and HDL greater than 40.  3.  We will see her back in the clinic in 6 months, sooner if needed.      Gweneth Fritter, PA-C  Section of Cardiology  [facility]     Ricky Ala, MD  Assistant Professor  [facility] Department of Cardiology    [id number]; D: 05/16/2011 13:10:11; T: 05/17/2011 09:30:41      I have seen and evaluated the patient jointly with Herbie Saxon PA-C and agree with her assessment, recommendations and plans.      Marny Lowenstein, MD 05/29/2011 8:54 AM      cc: Unice Bailey MD      [address]       [place], [place] [address]

## 2011-09-10 ENCOUNTER — Other Ambulatory Visit (HOSPITAL_BASED_OUTPATIENT_CLINIC_OR_DEPARTMENT_OTHER): Payer: Self-pay | Admitting: Interventional Cardiology

## 2011-09-14 MED ORDER — ATENOLOL 50 MG TABLET
50.0000 mg | ORAL_TABLET | Freq: Every day | ORAL | Status: DC
Start: 2011-09-14 — End: 2011-10-01

## 2011-09-14 MED ORDER — CLOPIDOGREL 75 MG TABLET
75.0000 mg | ORAL_TABLET | Freq: Every day | ORAL | Status: DC
Start: 2011-09-14 — End: 2011-10-01

## 2011-09-14 MED ORDER — ATORVASTATIN 20 MG TABLET
20.0000 mg | ORAL_TABLET | Freq: Every evening | ORAL | Status: DC
Start: 2011-09-14 — End: 2011-10-01

## 2011-09-14 MED ORDER — LISINOPRIL 2.5 MG TABLET
5.0000 mg | ORAL_TABLET | Freq: Every day | ORAL | Status: DC
Start: 2011-09-14 — End: 2011-10-01

## 2011-09-14 NOTE — Telephone Encounter (Signed)
Fax all RX's into [facility] pharmacy as requested.

## 2011-10-01 ENCOUNTER — Other Ambulatory Visit (HOSPITAL_BASED_OUTPATIENT_CLINIC_OR_DEPARTMENT_OTHER): Payer: Self-pay | Admitting: Interventional Cardiology

## 2011-10-01 MED ORDER — ATORVASTATIN 20 MG TABLET
20.0000 mg | ORAL_TABLET | Freq: Every evening | ORAL | Status: DC
Start: 2011-10-01 — End: 2012-09-24

## 2011-10-01 MED ORDER — LISINOPRIL 5 MG TABLET
5.0000 mg | ORAL_TABLET | Freq: Every day | ORAL | Status: DC
Start: 2011-10-01 — End: 2012-09-14

## 2011-10-01 MED ORDER — ATENOLOL 50 MG TABLET
50.0000 mg | ORAL_TABLET | Freq: Every day | ORAL | Status: DC
Start: 2011-10-01 — End: 2012-09-24

## 2011-10-01 MED ORDER — CLOPIDOGREL 75 MG TABLET
75.0000 mg | ORAL_TABLET | Freq: Every day | ORAL | Status: DC
Start: 2011-10-01 — End: 2012-03-12

## 2011-10-01 NOTE — Telephone Encounter (Signed)
 Pt changed mail order pharmacy and didn't have these prescriptions.  Called them into [facility] [phone].  Notified patient that this was handled and she was pleased that we called them in.

## 2011-10-01 NOTE — Telephone Encounter (Signed)
Message copied by Pervis Hocking on Mon Oct 01, 2011  1:45 PM  ------       Message from: Garrel Ridgel NICOLE       Created: Mon Oct 01, 2011 12:25 PM       Regarding: rx refill issues         >> WHITNEY NICOLE ROGERS 10/01/2011 12:25 PM       Irving Burton, Ms. Allers's daughter, called in this afternoon in regards to her mother's refill request. She states the pharmacy as well as her family have been trying to get the prescriptions refilled through the [place] office. She states she called in last week about the refills and was told by someone in our office that it is best to call our office to have the refills called in. She states her mother's prescriptions still have not been filled. She would like someone to call her back to discuss whether the refills have been called in or not as her mother only has a few days left on her prescriptions. Patient's cardiologist is Dr. Leo Rod. Thank you!

## 2011-10-24 ENCOUNTER — Encounter (INDEPENDENT_AMBULATORY_CARE_PROVIDER_SITE_OTHER): Payer: Self-pay | Admitting: Interventional Cardiology

## 2011-10-24 ENCOUNTER — Ambulatory Visit (INDEPENDENT_AMBULATORY_CARE_PROVIDER_SITE_OTHER): Payer: Medicare Other | Admitting: Interventional Cardiology

## 2011-10-24 MED ORDER — NITROGLYCERIN 0.4 MG SUBLINGUAL TABLET
0.4000 mg | SUBLINGUAL_TABLET | SUBLINGUAL | Status: DC | PRN
Start: 2011-10-24 — End: 2012-09-24

## 2011-11-05 NOTE — Progress Notes (Addendum)
OV dictated    Khaiden Segreto L. Rolondo Pierre, PA-C    Jason A Moreland, MD

## 2011-11-05 NOTE — Progress Notes (Addendum)
[facility]  [address] [place], [place] [address]    PROGRESS NOTE    PATIENT NAME: [name], [name]  CHART NUMBER: [id number]  DATE OF BIRTH: 08/05/1918  DATE OF SERVICE: 10/24/2011    [facility]  [address]  [place], [place]  [address]      CARDIAC HISTORY: The patient was initially evaluated on 09/13/10 for preoperative assessment prior to a fairly low risk noncardiac surgery. A transthoracic echocardiogram was performed and [facility] shortly after that clinic visit. This echocardiogram showed a mildly reduced ejection fraction with wall motion abnormalities involving the anteroseptal wall and apex. Because the patient was fairly asymptomatic and the surgery was a low-risk procedure, it was recommended she proceed with her surgery. She did have complaints of chest pain following the surgery and was diagnosed with a non-ST segment elevation acute myocardial infarction. A transthoracic echocardiogram on 09/19/10 showed an ejection fraction of 35% with hypokinesia involving the mid to distal anteroseptal wall. The apex appears dyskinetic. There was septal dyssynergy. There is mild diastolic dysfunction mildly dilated left atrium. The right ventricular systolic pressure was mildly elevated at 45 mmHg consistent with mild pulmonary hypertension. She was started on aggressive medical therapy with resolution of her symptoms. Shortly after her hospital discharge, her symptoms returned and continued despite escalating doses of antianginal medications. The patient was brought to the cardiac catheterization laboratory on 09/29/10. Mid left anterior descending coronary artery contains a severe lesion, but the vessel tapered in size distal to the lesion. Based on the echocardiogram findings, this was felt to represent an old myocardial infarction with spontaneous recannulization of the vessel. There were multiple areas of severe disease within the right coronary system. An 80% lesion in the mid posterior lateral branch was treated with a 3.0 x 15 mm Integrity stent. A 70% lesion in the mid right coronary artery was treated with 4.0 x 12 mm Integrity stent. This was overlapped distally with a 3.5 x 12 mm Integrity stent to treat a distal edge dissection.  The proximal right coronary artery contained an 80% ulcerated plaque which was felt to be the cause of the patient's non-ST segment elevation acute myocardial infarction. This was treated with a 4.0 x 15 mm Integrity stent.    SUBJECTIVE:  In the office today, she denies any complaints of chest pain.  She has had some mild chronic dyspnea, she states without change or exacerbation.  She does on occasion have some dependent edema without orthopnea or PND.  She does complain of cold feet over the past 1 year, as well.  We have discussed Plavix use and that she is okay to stop.    ALLERGIES:  PHENERGAN.    MEDICATIONS:  1.  Vitamin C 1000 mg p.o. daily.  2.  Aspirin 81 mg p.o. daily.  3.  Tenormin 50 mg 1 p.o. daily.  4.  Lipitor 20 mg p.o. daily.  5.  Bimatoprost 0.03% both eyes at night.  6.  Calcium chew 1 p.o. daily.  7.  Plavix 75 mg 1 p.o. daily.  8.  Trusopt 2% one drop in left eye t.i.d.  9.  Prinivil 5 mg 1 p.o. daily.  10.  Multivitamin 1 p.o. daily.  11.  Omega 3 fatty acid 1 p.o. b.i.d.    REVIEW OF SYSTEMS:  She denies either fever or chills.  No headache, syncope or near syncopal event.  Denies chest pain.  No shortness of breath at  rest; however, she does have some chronic dyspnea upon exertion that is without change or exacerbation.  No orthopnea or PND.  No palpitations.  No cough or hemoptysis.  No abdominal pain, nausea, vomiting, diarrhea or change in either bowel or bladder habits.  No melena or tarry stool.  No edema nor known weight change.     OBJECTIVE:  On physical exam, blood pressure 120/70, pulse is 64 and regular, respirations 18, even and nonlabored.  SpO2 is 97% on room air.  Height is 5 feet 4 inches.  Weight 145 pounds.  On general exam, she is alert and oriented x3.  HEENT is normocephalic, atraumatic.  Sclerae is clear, nonicteric.  Neck is supple without adenopathy, JVD or bruit.  There is no thyromegaly.  Lungs are clear to auscultation with equal air entry bilaterally.  Heart is regular rate with a 1/6 systolic ejection murmur without gallop or rub..  Abdomen is soft, nontender, nondistended without hepatosplenomegaly or palpable mass. There are positive bowel sounds throughout.  Extremities are without clubbing, cyanosis or edema as well as symmetrical pulses.     ASSESSMENT:  1.  Non-ST elevated MI with 4 drug-eluting stents to her RCA.  2.  Severe mid LAD stenosis with a known baseline of wall motion abnormality.  3.  Depressed LV function with an EF of 30%.  4.  Hypertension, well controlled.  5.  Hyperlipidemia.    PLAN:  1.  She may discontinue her Plavix.  2.  Risk factor modification including lipid management by primary care physician with LDL optimal goal less than 70, triglycerides less than 150 and HDL greater than 40.  3.  Aspirin, Lipitor as well as beta-blockade along with ACE.  4.  We will see her back in the clinic in 6 months, sooner if needed.      Gweneth Fritter, PA-C  Section of Cardiology  [facility]    Ricky Ala, MD  Assistant Professor  [facility] Department of Cardiology    [id number]; D: 11/05/2011 15:02:38; T: 11/05/2011 17:51:20      I have seen and evaluated the patient jointly with Herbie Saxon PA-C and agree with her assessment, recommendations and plans.      Marny Lowenstein, MD 01/19/2012 12:27 PM      cc: Unice Bailey MD      [address]       [place], [place] [address]

## 2012-03-12 ENCOUNTER — Ambulatory Visit (INDEPENDENT_AMBULATORY_CARE_PROVIDER_SITE_OTHER): Payer: Medicare Other | Admitting: Interventional Cardiology

## 2012-03-12 VITALS — BP 128/82 | HR 74 | Resp 18 | Ht 64.0 in | Wt 153.0 lb

## 2012-04-01 NOTE — Progress Notes (Addendum)
 CARDIAC HISTORY: The patient was initially evaluated on 09/13/10 for preoperative assessment prior to a fairly low risk noncardiac surgery. A transthoracic echocardiogram was performed and [facility] shortly after that clinic visit. This echocardiogram showed a mildly reduced ejection fraction with wall motion abnormalities involving the anteroseptal wall and apex. Because the patient was fairly asymptomatic and the surgery was a low-risk procedure, it was recommended she proceed with her surgery. She did have complaints of chest pain following the surgery and was diagnosed with a non-ST segment elevation acute myocardial infarction. A transthoracic echocardiogram on 09/19/10 showed an ejection fraction of 35% with hypokinesia involving the mid to distal anteroseptal wall. The apex appears dyskinetic. There was septal dyssynergy. There is mild diastolic dysfunction mildly dilated left atrium. The right ventricular systolic pressure was mildly elevated at 45 mmHg consistent with mild pulmonary hypertension. She was started on aggressive medical therapy with resolution of her symptoms. Shortly after her hospital discharge, her symptoms returned and continued despite escalating doses of antianginal medications. The patient was brought to the cardiac catheterization laboratory on 09/29/10. Mid left anterior descending coronary artery contains a severe lesion, but the vessel tapered in size distal to the lesion. Based on the echocardiogram findings, this was felt to represent an old myocardial infarction with spontaneous recannulization of the vessel. There were multiple areas of severe disease within the right coronary system. An 80% lesion in the mid posterior lateral branch was treated with a 3.0 x 15 mm Integrity stent. A 70% lesion in the mid right coronary artery was treated with 4.0 x 12 mm Integrity stent. This was overlapped distally with a 3.5 x 12 mm Integrity stent to treat a distal edge dissection.  The proximal right coronary artery contained an 80% ulcerated plaque which was felt to be the cause of the patient's non-ST segment elevation acute myocardial infarction. This was treated with a 4.0 x 15 mm Integrity stent.  Bilateral carotid artery Dopplers were performed on 04/10/11.  This showed mild (less than 40%) bilateral internal carotid artery stenosis.  The vertebral arteries were patent with antegrade flow.    SUBJECTIVE: The patient presents to cardiology clinic today for followup of her coronary artery disease, hypertension, and hyperlipidemia.  She states that she has had a few episodes of nausea (her anginal equivalent and (since her last appointment.  These episodes have all resolved with one or 2 sublingual nitroglycerin .  She denies any symptoms of chest pain, dyspnea on exertion, paroxysmal nocturnal dyspnea, orthopnea, palpitations, lower extremity swelling, or syncope.  She states that she was recently diagnosed with diabetes mellitus and was started on metformin.    Current Outpatient Prescriptions   Medication Status Sig   . metFORMIN (GLUCOPHAGE) 500 mg Oral Tablet Active Take 500 mg by mouth Every morning with breakfast   . nitroglycerin  (NITROSTAT ) 0.4 mg Sublingual Tablet, Sublingual Active 1 Tab (0.4 mg total) by Sublingual route Every 5 minutes as needed for Chest pain for 3 doses over 15 minutes   . atenolol  (TENORMIN ) 50 mg Oral Tablet Active Take 1 Tab (50 mg total) by mouth Once a day   . atorvastatin  (LIPITOR ) 20 mg Oral Tablet Active Take 1 Tab (20 mg total) by mouth QPM   . lisinopril  (PRINIVIL ) 5 mg Oral Tablet Active Take 1 Tab (5 mg total) by mouth Once a day   . Aspirin  81 mg Oral Tablet Active take  by mouth Once a day.     . OMEGA-3 FATTY ACIDS (FISH  OIL ORAL) Active Take 3 Tabs by mouth Once a day    . acetaminophen  (TYLENOL ) 500 mg Oral Tablet Active take 500 mg by mouth QPM.     . Ascorbic Acid  (VITAMIN C ) 1,000 mg Oral Tablet Active take 1,000 mg by mouth Once a day.   .  multivitamin Oral Tablet Active take 1 Tab by mouth Once a day.   . CA CARBONATE/VITAMIN D3/VIT K (CALCIUM  CHEW ORAL) Active take  by mouth.   . Bimatoprost  (BIMATOPROST ) 0.03 % Ophthalmic Drops Active by Both Eyes route every night.   . dorzolamide  (TRUSOPT ) 2 % Ophthalmic Drops Active 1 Drop by Left Eye route Three times a day.     No current facility-administered medications for this visit.       OBJECTIVE:  Filed Vitals:    03/12/12 0952   BP: 128/82   Pulse: 74   Resp: 18   Height: 1.626 m (5' 4)   Weight: 69.4 kg (153 lb)   SpO2: 99%     General: The patient is alert and oriented and is in no acute distress.  HEENT: There is no evidence of JVD, carotid bruit, or thyromegaly.  Respiratory: The lungs are clear to auscultation bilaterally without any distinct crackles or wheezing.  Cardiac: The heart is regular without any evidence of murmur, rub, gallop, or S3.  Gastrointestinal: The abdomen is soft, nontender, and nondistended. There are normal active bowel sounds present. There is no evidence of hepatosplenomegaly, mass, or bruit.  Extremities: There is no evidence of clubbing, cyanosis, or edema. The pulses in the bilateral lower extremities are normal and symmetric.    Review of outside laboratory studies from 02/21/12 showed a creatinine of 0.78, AST 17, ALT 18, potassium 4.7, total cholesterol 119, HDL 35, LDL 47, triglycerides 186, and a hemoglobin A1C of 6.5.    ASSESSMENT AND PLAN: The patient is a 78 y.o. lady with:  #1.  Coronary artery disease.  The patient currently has stable anginal equivalent symptoms resolve with nitroglycerin .  She was instructed to continue to use sublingual nitroglycerin  for these episodes.  She was instructed to notify the clinic if these symptoms worsen in any way.  She was instructed to present to the emergency department if they were not relieved with nitroglycerin .  For now she is to continue her current regimen of medications including aspirin , lisinopril , atenolol , and  Lipitor .  She is to return to clinic in one year or sooner as dictated by symptoms.  #2.  Hypertension.  The patient's blood pressure is fairly well controlled on her current dose of atenolol  and lisinopril .  She is to continue these medications and follow with her primary care provider for monitoring her blood pressure and heart rate in titrating his medications as needed.  #3.  Hyperlipidemia.  The patient's LDL cholesterol is well below his goal of less than 70.  She is to continue on her current dose of Lipitor .  She is to follow with her primary care provider for yearly monitoring of her cholesterol and adjustment of her Lipitor  if the LDL creeps up of 70.  #4.  Ischemic cardiomyopathy.  The patient has never manifested any symptoms of congestive heart failure.  She is currently [place]  Heart Association class II based on her functional capacity.  If she develops symptoms in the future, diuretic therapy would be indicated.  If she develops symptoms of congestive heart failure her atenolol  should be changed to Coreg or Toprol-XL as well.  She  has not had a repeat transthoracic echocardiogram as she has not manifested any symptoms of congestive heart failure and she has declined considering placement of an implantable cardiac defibrillator even if her ejection fraction qualifies her to have one placed.        Selinda DELENA Corolla, MD      ADDENDUM    The patient informed clinic that she needed to have some teeth extracted. Her dentist, Christopher Baron DDS, wanted cardiac clearance for this procedure. The patient has coronary artery disease and ischemic cardiomyopathy. She has symptoms of stable angina and [place]  heart association class II heart failure. I do not feel that any cardiac testing is warranted as it would not change our management or impact her risk of cardiac complications with this low-risk procedure. She is at moderate risk (2% chance) of cardiac complications (developing angina, acute myocardial  infarction, worsening congestive heart failure, significant arrhythmia, or death) with any procedure involving general anesthesia. Her risk is probably less with this low risk procedure. She can hold her aspirin  therapy 5-7 days prior to the dental extraction. The aspirin  can be restarted when appropriate after the procedure. Any post procedure monitoring of cardiac enzymes, electrocardiograms, or echocardiogram should be reserved for the development of symptoms of chest pain, shortness of breath, or hypotension. A cardiology consultation should be obtained if there is any objective evidence of ischemia.  She should be continued on her other cardiac medications throughout the perioperative period.      Selinda DELENA Corolla, MD 07/25/2012, 8:35 AM

## 2012-09-14 ENCOUNTER — Other Ambulatory Visit (INDEPENDENT_AMBULATORY_CARE_PROVIDER_SITE_OTHER): Payer: Self-pay | Admitting: Interventional Cardiology

## 2012-09-24 ENCOUNTER — Ambulatory Visit (INDEPENDENT_AMBULATORY_CARE_PROVIDER_SITE_OTHER): Payer: Medicare Other | Admitting: Interventional Cardiology

## 2012-09-24 VITALS — BP 112/78 | HR 73 | Resp 18 | Ht 64.0 in | Wt 151.0 lb

## 2012-09-24 MED ORDER — ATORVASTATIN 20 MG TABLET
20.0000 mg | ORAL_TABLET | Freq: Every evening | ORAL | Status: DC
Start: 2012-09-24 — End: 2014-12-27

## 2012-09-24 MED ORDER — NITROGLYCERIN 0.4 MG SUBLINGUAL TABLET
0.4000 mg | SUBLINGUAL_TABLET | SUBLINGUAL | Status: DC | PRN
Start: 2012-09-24 — End: 2013-01-26

## 2012-09-24 MED ORDER — ATENOLOL 50 MG TABLET
50.0000 mg | ORAL_TABLET | Freq: Every day | ORAL | Status: DC
Start: 2012-09-24 — End: 2014-01-19

## 2012-09-24 NOTE — Progress Notes (Addendum)
[facility]  [address] [place], [place] [address]    PROGRESS NOTE    PATIENT NAME: [name], [name]  CHART NUMBER: [id number]  DATE OF BIRTH: [birth year]  DATE OF SERVICE: 09/24/2012    [facility]  [address]  [place], [place]  [address]      CARDIAC HISTORY: The patient was initially evaluated on 09/13/10 for preoperative assessment prior to a fairly low risk noncardiac surgery. A transthoracic echocardiogram was performed and [facility] shortly after that clinic visit. This echocardiogram showed a mildly reduced ejection fraction with wall motion abnormalities involving the anteroseptal wall and apex. Because the patient was fairly asymptomatic and the surgery was a low-risk procedure, it was recommended she proceed with her surgery. She did have complaints of chest pain following the surgery and was diagnosed with a non-ST segment elevation acute myocardial infarction. A transthoracic echocardiogram on 09/19/10 showed an ejection fraction of 35% with hypokinesia involving the mid to distal anteroseptal wall. The apex appears dyskinetic. There was septal dyssynergy. There is mild diastolic dysfunction mildly dilated left atrium. The right ventricular systolic pressure was mildly elevated at 45 mmHg consistent with mild pulmonary hypertension. She was started on aggressive medical therapy with resolution of her symptoms. Shortly after her hospital discharge, her symptoms returned and continued despite escalating doses of antianginal medications. The patient was brought to the cardiac catheterization laboratory on 09/29/10. Mid left anterior descending coronary artery contains a severe lesion, but the vessel tapered in size distal to the lesion. Based on the echocardiogram findings, this was felt to represent an old myocardial infarction with spontaneous recannulization of the vessel. There were multiple areas of severe disease within the right coronary system. An 80% lesion in the mid posterior lateral branch was treated with a 3.0 x 15 mm Integrity stent. A 70% lesion in the mid right coronary artery was treated with 4.0 x 12 mm Integrity stent. This was overlapped distally with a 3.5 x 12 mm Integrity stent to treat a distal edge dissection.  The proximal right coronary artery contained an 80% ulcerated plaque which was felt to be the cause of the patient's non-ST segment elevation acute myocardial infarction. This was treated with a 4.0 x 15 mm Integrity stent. Bilateral carotid artery Dopplers were performed on 04/10/11. This showed mild (less than 40%) bilateral internal carotid artery stenosis. The vertebral arteries were patent with antegrade flow.    SUBJECTIVE:  [name] reports to clinic today for followup.  She reports since her last visit she has only had 1 episode of her anginal "nausea".  She took 1 nitroglycerine for this sensation of nausea.  The pain eventually went away but it took several minutes after the nitroglycerine.  She had some chronic and stable shortness of breath which has actually dramatically improved since her PCI and is now not bothersome to her.  She has some lower extremity edema that is worse in the morning but better with walking, likely secondary to arthritis.  Blood pressure has been well controlled.  Hyperlipidemia and diabetes are monitored by her PCP.  She chooses to decline ICD.  She is tolerating her cardiac regimen well.  She is requesting refills today.  She has no other complaints at this time.      MEDICATIONS:    Current Outpatient Prescriptions   Medication Sig   . acetaminophen (TYLENOL) 500 mg Oral Tablet take 500 mg by mouth QPM.     .  Ascorbic Acid (VITAMIN C) 1,000 mg Oral Tablet take 1,000 mg by mouth Once a day.   . Aspirin 81 mg Oral Tablet take  by mouth Once a day.     Marland Kitchen atenolol (TENORMIN) 50 mg Oral Tablet Take 1 Tab (50 mg total) by mouth Once a day   . atorvastatin (LIPITOR) 20 mg Oral Tablet Take 1 Tab (20 mg total) by mouth Every evening   . Bimatoprost (BIMATOPROST) 0.03 % Ophthalmic Drops by Both Eyes route every night.   . CA CARBONATE/VITAMIN D3/VIT K (CALCIUM CHEW ORAL) take  by mouth.   . dorzolamide (TRUSOPT) 2 % Ophthalmic Drops 1 Drop by Left Eye route Three times a day.    . lisinopril (PRINIVIL) 5 mg Oral Tablet TAKE 1 TABLET DAILY   . metFORMIN (GLUCOPHAGE) 500 mg Oral Tablet Take 500 mg by mouth Every morning with breakfast   . multivitamin Oral Tablet take 1 Tab by mouth Once a day.   . nitroglycerin (NITROSTAT) 0.4 mg Sublingual Tablet, Sublingual 1 Tab (0.4 mg total) by Sublingual route Every 5 minutes as needed for Chest pain for 3 doses over 15 minutes   . OMEGA-3 FATTY ACIDS (FISH OIL ORAL) Take 3 Tabs by mouth Once a day        OBJECTIVE:  On exam, her pressure is 112/78, pulse 73, height 5 foot 4 inches, weight 151 pounds.  02 sats normal on room air.  She is a 79 year old female who appears her stated age, in no acute distress.  Neck reveals no JVD or carotid bruit.  Lungs clear to auscultation.  Heart is regular rate and rhythm with no murmurs or rubs.  Abdomen is soft and nontender.  Legs reveal trace edema.  Skin is warm and dry.  Neurologic:  She is grossly intact.  She is alert and oriented x3.      ASSESSMENT AND PLAN:   1.  Hypertension, well controlled.  Continue current medication regimen.  2.  Hyperlipidemia.  Recommend LDL of less 100, preferably less.   3.  Diabetes mellitus.  Recommend hemoglobin A1c at 7.  4.  Ischemic cardiomyopathy.  The patient has no signs or symptoms consistent with heart failure.  She may continue on current medication regimen.  She continues to decline ICD at this time.   5.  Coronary artery disease, asymptomatic.  The patient had 1 episode of nausea since her last visit but stable angina.  She may continue on her cardiac regimen.    6.  Return to the clinic in 6 months or sooner if problems arise or develop.      Sloan Leiter, PA-C  [facility]    Ricky Ala, MD  Assistant Professor  [facility] Department of Cardiology    [id number]; D: 09/24/2012 10:12:00; T: 09/24/2012 10:50:13       I have seen and evaluated the patient jointly with Sloan Leiter, PA-C and agree with her assessment, recommendations and plans.  I confirmed that the patient's heart was in regular rhythm during my physical examination.    Marny Lowenstein, MD 10/12/2012, 3:13 PM       cc: Talmadge Chad NP      [address]       [place], [place] [address]

## 2013-01-26 ENCOUNTER — Other Ambulatory Visit (INDEPENDENT_AMBULATORY_CARE_PROVIDER_SITE_OTHER): Payer: Self-pay | Admitting: Interventional Cardiology

## 2013-05-06 ENCOUNTER — Ambulatory Visit (INDEPENDENT_AMBULATORY_CARE_PROVIDER_SITE_OTHER): Payer: Medicare Other | Admitting: Interventional Cardiology

## 2013-05-06 ENCOUNTER — Encounter (INDEPENDENT_AMBULATORY_CARE_PROVIDER_SITE_OTHER): Payer: Medicare Other | Admitting: Interventional Cardiology

## 2013-05-06 VITALS — BP 102/78 | HR 77 | Resp 18 | Ht 64.0 in | Wt 153.0 lb

## 2013-05-19 NOTE — Progress Notes (Addendum)
OV dictated    Ashley Pineiro L. Pascha Fogal, PA-C

## 2013-05-19 NOTE — Progress Notes (Addendum)
[facility]  [address] [place], [place] [address]    PROGRESS NOTE    PATIENT NAME: [name], [name]  CHART NUMBER: [id number]  DATE OF BIRTH: December 08, 1918  DATE OF SERVICE: 05/06/2013    [facility]  [address]  [place], [place]  [address]     CARDIAC HISTORY: The patient was initially evaluated on 09/13/10 for preoperative assessment prior to a fairly low risk noncardiac surgery. A transthoracic echocardiogram was performed and [facility] shortly after that clinic visit. This echocardiogram showed a mildly reduced ejection fraction with wall motion abnormalities involving the anteroseptal wall and apex. Because the patient was fairly asymptomatic and the surgery was a low-risk procedure, it was recommended she proceed with her surgery. She did have complaints of chest pain following the surgery and was diagnosed with a non-ST segment elevation acute myocardial infarction. A transthoracic echocardiogram on 09/19/10 showed an ejection fraction of 35% with hypokinesia involving the mid to distal anteroseptal wall. The apex appears dyskinetic. There was septal dyssynergy. There is mild diastolic dysfunction mildly dilated left atrium. The right ventricular systolic pressure was mildly elevated at 45 mmHg consistent with mild pulmonary hypertension. She was started on aggressive medical therapy with resolution of her symptoms. Shortly after her hospital discharge, her symptoms returned and continued despite escalating doses of antianginal medications. The patient was brought to the cardiac catheterization laboratory on 09/29/10. Mid left anterior descending coronary artery contains a severe lesion, but the vessel tapered in size distal to the lesion. Based on the echocardiogram findings, this was felt to represent an old myocardial infarction with spontaneous recannulization of the vessel. There were multiple areas of severe disease within the right coronary system. An 80% lesion in the mid posterior lateral branch was treated with a 3.0 x 15 mm Integrity stent. A 70% lesion in the mid right coronary artery was treated with 4.0 x 12 mm Integrity stent. This was overlapped distally with a 3.5 x 12 mm Integrity stent to treat a distal edge dissection.  The proximal right coronary artery contained an 80% ulcerated plaque which was felt to be the cause of the patient's non-ST segment elevation acute myocardial infarction. This was treated with a 4.0 x 15 mm Integrity stent. Bilateral carotid artery Dopplers were performed on 04/10/11. This showed mild (less than 40%) bilateral internal carotid artery stenosis. The vertebral arteries were patent with antegrade flow.     SUBJECTIVE:  This is a 79 year old white female who is followed in the clinic with a history of coronary artery disease with multiple stenting to her RCA x4.  She also had mid severe LAD disease, but the vessel tapered in size distal to the lesion and the patient also a history of ischemic cardiomyopathy with an EF of 35%, declining ICD implant.  Risk factors also include hypertension, as well as hyperlipidemia and diabetes mellitus noninsulin dependent.    She is in the office today stating that she has recently had difficulty with her blood pressure going as high as 160s systolic with her primary care physician adjusting her Prinivil from 5 to 10 mg and at one point 20 mg, although she has now tapered back to 10 mg with better control.  Her girls and she are here today for a blood pressure recheck with recording of 102/78.  She has had no reports of chest pain, shortness of breath or dyspnea upon exertion.  There has been no evidence of heart failure, no  orthopnea, PND or peripheral edema.  She leads an inactive lifestyle.  However, feels she is able to accomplish all tasks that she wishes to pursue with pacing.    ALLERGIES:  PHENERGAN.    MEDICATIONS:  1.  Vitamin C 1000 mg p.o. daily.  2.  Aspirin 81 mg p.o. daily.  3.  Tenormin 50 mg p.o. at hour of sleep.  4.  Lipitor 20 mg p.o. daily.  5.  Calcium 2 p.o. daily.  6.  Trusopt 2% one drop left eye t.i.d.  7.  Prinivil 10 mg p.o. daily.  8.  Glucophage 500 mg p.o. daily.  9.  Multivitamin 1 p.o. daily.  10.  Omega 3 fatty acid 1 p.o. daily.     REVIEW OF SYSTEMS:  No fever or chills.  No headache, syncope or near syncopal event.  Denies chest pain, shortness of breath at rest or dyspnea upon exertion.  No orthopnea or PND.  No palpitations.  She does on occasion have fatigue.  No cough or hemoptysis.  No abdominal pain, nausea, vomiting, diarrhea or change in either bowel or bladder habits.  No melena or tarry stool.  No edema nor known weight change.    OBJECTIVE:  On physical exam, blood pressure 102/78, pulse is 77 and regular, respirations 18 even and nonlabored.  Height is 5 feet 4 inches, weight 153, and SpO2 is 96% on room air.  On general exam, she is alert and oriented x3.  HEENT is normocephalic, atraumatic.  Sclerae is clear, nonicteric.  Neck is supple without adenopathy, JVD or bruit.  There is no thyromegaly.  Lungs are clear to auscultation with equal air entry bilaterally.  Heart is regular rate without audible murmur, gallop or rub.  Abdomen is soft, nontender, nondistended without hepatosplenomegaly or palpable mass.  Positive bowel sounds throughout.  Extremities are without clubbing, cyanosis or edema as well as symmetrical pulses.    ASSESSMENT:  1.  Coronary artery disease with multiple RCA stents.  2.  Hypertension with recent medication adjustment with reported better control.  3.  Ischemic cardiomyopathy with an EF of 30%.  She has no frank evidence of congestive heart failure today and again has declined ICD implant in the past.  4.  Hyperlipidemia.    PLAN:  1.  We will continue present medications today without change including her aspirin, Tenormin and Prinivil.  2.  Risk factor modification including lipid management by primary care physician with LDL optimal goal less than 70, triglycerides less than 150 and HDL greater than 40.  3.  We will see her back in the clinic as scheduled in either March or April of this next year, sooner if needed.      Gweneth Fritter, PA-C  Section of Cardiology  [place] Department of Medicine     Ricky Ala, MD  Assistant Professor  [facility] Department of Cardiology    [id number]; D: 05/19/2013 15:10:50; T: 05/19/2013 15:40:38      I have seen and evaluated the patient jointly with Herbie Saxon PA-C and agree with her assessment, recommendations and plans.  I confirmed that the patient's heart was in regular rhythm during my physical examination.      Marny Lowenstein, MD 05/28/2013 7:35 AM      cc: Talmadge Chad NP      [address]       [place], [place] [address]

## 2013-06-24 ENCOUNTER — Encounter (INDEPENDENT_AMBULATORY_CARE_PROVIDER_SITE_OTHER): Payer: Medicare Other | Admitting: Interventional Cardiology

## 2013-10-07 ENCOUNTER — Ambulatory Visit (INDEPENDENT_AMBULATORY_CARE_PROVIDER_SITE_OTHER): Payer: Medicare Other | Admitting: Interventional Cardiology

## 2013-10-07 VITALS — BP 128/68 | HR 68 | Resp 16 | Ht 64.0 in | Wt 148.0 lb

## 2013-10-07 DIAGNOSIS — I255 Ischemic cardiomyopathy: Secondary | ICD-10-CM

## 2013-10-07 DIAGNOSIS — I2510 Atherosclerotic heart disease of native coronary artery without angina pectoris: Secondary | ICD-10-CM

## 2013-10-07 DIAGNOSIS — I259 Chronic ischemic heart disease, unspecified: Secondary | ICD-10-CM

## 2013-10-07 DIAGNOSIS — I2589 Other forms of chronic ischemic heart disease: Secondary | ICD-10-CM

## 2013-10-07 DIAGNOSIS — I10 Essential (primary) hypertension: Secondary | ICD-10-CM

## 2013-10-07 DIAGNOSIS — E785 Hyperlipidemia, unspecified: Secondary | ICD-10-CM

## 2013-10-08 NOTE — Progress Notes (Signed)
CARDIAC HISTORY: The patient was initially evaluated on 09/13/10 for preoperative assessment prior to a fairly low risk noncardiac surgery. A transthoracic echocardiogram was performed and [facility] shortly after that clinic visit. This echocardiogram showed a mildly reduced ejection fraction with wall motion abnormalities involving the anteroseptal wall and apex. Because the patient was fairly asymptomatic and the surgery was a low-risk procedure, it was recommended she proceed with her surgery. She did have complaints of chest pain following the surgery and was diagnosed with a non-ST segment elevation acute myocardial infarction. A transthoracic echocardiogram on 09/19/10 showed an ejection fraction of 35% with hypokinesia involving the mid to distal anteroseptal wall. The apex appears dyskinetic. There was septal dyssynergy. There is mild diastolic dysfunction mildly dilated left atrium. The right ventricular systolic pressure was mildly elevated at 45 mmHg consistent with mild pulmonary hypertension. She was started on aggressive medical therapy with resolution of her symptoms. Shortly after her hospital discharge, her symptoms returned and continued despite escalating doses of antianginal medications. The patient was brought to the cardiac catheterization laboratory on 09/29/10. Mid left anterior descending coronary artery contains a severe lesion, but the vessel tapered in size distal to the lesion. Based on the echocardiogram findings, this was felt to represent an old myocardial infarction with spontaneous recannulization of the vessel. There were multiple areas of severe disease within the right coronary system. An 80% lesion in the mid posterior lateral branch was treated with a 3.0 x 15 mm Integrity stent. A 70% lesion in the mid right coronary artery was treated with 4.0 x 12 mm Integrity stent. This was overlapped distally with a 3.5 x 12 mm Integrity stent to treat a distal edge dissection.  The proximal right coronary artery contained an 80% ulcerated plaque which was felt to be the cause of the patient's non-ST segment elevation acute myocardial infarction. This was treated with a 4.0 x 15 mm Integrity stent. Bilateral carotid artery Dopplers were performed on 04/10/11. This showed mild (less than 40%) bilateral internal carotid artery stenosis. The vertebral arteries were patent with antegrade flow.    SUBJECTIVE: The patient presents to cardiology clinic today for followup of her coronary artery disease, hypertension, and hyperlipidemia. She states that overall she is doing pretty well. She states that there are 1-2 days per week that she just doesn't feel well in the mornings. This is not necessarily do to chest pain or shortness of breath. Occasionally she does use sublingual nitroglycerin which she thinks makes her feel better. She does not have any chest pain or dyspnea that limits her physical activity. She denies any paroxysmal nocturnal dyspnea, orthopnea, palpitations, or syncope. She does get some minor lower extremity swelling from time to time that resolves overnight and is not concerning to her.    Current Outpatient Prescriptions   Medication Sig    acetaminophen (TYLENOL) 500 mg Oral Tablet take 500 mg by mouth QPM.      Ascorbic Acid (VITAMIN C) 1,000 mg Oral Tablet take 1,000 mg by mouth Once a day.    Aspirin 81 mg Oral Tablet take  by mouth Once a day.      atenolol (TENORMIN) 50 mg Oral Tablet Take 1 Tab (50 mg total) by mouth Once a day    atorvastatin (LIPITOR) 20 mg Oral Tablet Take 1 Tab (20 mg total) by mouth Every evening    Bimatoprost (BIMATOPROST) 0.03 % Ophthalmic Drops by Both Eyes route every night.    CA CARBONATE/VITAMIN D3/VIT K (  CALCIUM CHEW ORAL) take  by mouth.    dorzolamide (TRUSOPT) 2 % Ophthalmic Drops 1 Drop by Left Eye route Three times a day.    lisinopril (PRINIVIL) 10 mg Oral Tablet Take 10 mg by mouth Once a day    metFORMIN (GLUCOPHAGE) 500 mg  Oral Tablet Take 500 mg by mouth Every morning with breakfast    multivitamin Oral Tablet take 1 Tab by mouth Once a day.    NITROSTAT 0.4 mg Sublingual Tablet, Sublingual DISSOLVE 1 TAB UNDER TONGUE FOR CHEST PAIN - IF PAIN REMAINS AFTER 5 MIN, CALL 911 AND REPEAT DOSE. MAX 3 TABS IN 15 MINUTES    OMEGA-3 FATTY ACIDS (FISH OIL ORAL) Take 3 Tabs by mouth Once a day        OBJECTIVE:  Filed Vitals:    10/07/13 1121   BP: 128/68   Pulse: 68   Resp: 16   Height: 1.626 m (5\' 4" )   Weight: 67.132 kg (148 lb)   SpO2: 96%     General: The patient is alert and oriented and is in no acute distress.  HEENT: There is no evidence of JVD, carotid bruit, or thyromegaly.  Respiratory: The lungs are clear to auscultation bilaterally without any distinct crackles or wheezing.  Cardiac: The heart is regular without any evidence of rub, gallop, or S3. There is a soft 1-2/6 systolic murmur noted at the upper sternal borders.  Gastrointestinal: The abdomen is soft, nontender, and nondistended. There are normal active bowel sounds present. There is no evidence of hepatosplenomegaly, mass, or bruit.  Extremities: There is no evidence of clubbing, cyanosis, or edema. The pulses in the bilateral lower extremities are normal and symmetric.    ASSESSMENT AND PLAN: The patient is a 80 y.o. lady with:  #1. Coronary artery disease. The patient has not had any specific complaints of angina or dyspnea on exertion. She is to continue her current regimen of medication including aspirin, atenolol, lisinopril, Lipitor, and fish oil. She is to return to clinic in one year or sooner as indicated by symptoms.  #2. Hypertension. The patient's blood pressure has been well-controlled on her current doses of atenolol and lisinopril. She is to continue on his medications and follow with her primary care provider for monitoring for blood pressure and heart rate and titration of these medications as needed.  #3. Hyperlipidemia. I do not have any recent  fasting lipid panel for review. She is to follow with her primary care provider for monitoring for fasting lipid panel and AST once a year as she is on a statin medication. She is to continue her current dose of Lipitor and fish oil. If her LDL cholesterol is not below a goal of 70, the Lipitor should be titrated to effect. If her triglycerides are not controlled, TriCor could be added to the fish oil. If her HDL cholesterol is not above a goal of 40, niacin therapy could be considered.  #4. Upcoming carotid artery ultrasound. The patient states that she has been scheduled for a carotid artery duplex through her primary care provider. I discussed this with her a little bit today. She states that because of the risk of stroke with carotid endarterectomy and carotid artery stenting that she would not consider undergoing these procedures at her age even if she did have significant carotid artery stenosis. She is going to discuss this further with her primary care provider in likely cancel her carotid artery ultrasound.      Barbara Cower  Merlinda Frederick, MD

## 2013-12-29 LAB — HGA1C (HEMOGLOBIN A1C WITH EST AVG GLUCOSE): HEMOGLOBIN A1C: 6.1 — ABNORMAL HIGH

## 2013-12-29 LAB — LIPID PANEL
CHOLESTEROL: 113
HDL-CHOLESTEROL: 31 — ABNORMAL LOW
LDL (CALCULATED): 46
TRIGLYCERIDES: 178 — ABNORMAL HIGH
VLDL (CALCULATED): 36

## 2014-01-19 ENCOUNTER — Other Ambulatory Visit (INDEPENDENT_AMBULATORY_CARE_PROVIDER_SITE_OTHER): Payer: Self-pay | Admitting: Interventional Cardiology

## 2014-01-19 DIAGNOSIS — I2510 Atherosclerotic heart disease of native coronary artery without angina pectoris: Secondary | ICD-10-CM

## 2014-01-19 DIAGNOSIS — I255 Ischemic cardiomyopathy: Secondary | ICD-10-CM

## 2014-01-19 DIAGNOSIS — I10 Essential (primary) hypertension: Secondary | ICD-10-CM

## 2014-02-01 ENCOUNTER — Encounter (HOSPITAL_BASED_OUTPATIENT_CLINIC_OR_DEPARTMENT_OTHER): Payer: Self-pay | Admitting: Interventional Cardiology

## 2014-02-03 ENCOUNTER — Other Ambulatory Visit (INDEPENDENT_AMBULATORY_CARE_PROVIDER_SITE_OTHER): Payer: Self-pay

## 2014-03-01 ENCOUNTER — Encounter (HOSPITAL_BASED_OUTPATIENT_CLINIC_OR_DEPARTMENT_OTHER): Payer: Self-pay | Admitting: Interventional Cardiology

## 2014-03-01 NOTE — Progress Notes (Signed)
meds  Received: 1 month ago     Awilda Bill, [place]  [facility]             Documentation     Summary: meds      >> Awilda Bill, CS 01/29/2014 12:14 PM  Dr. Phill Mutter office called - they wanted to report to Dr. Leo Rod that Cintya's Lisinopril has been decreased to 5 mg and her blood pressure is improving.   [phone]  Thank you                  Call History        Type Contact Phone User     01/29/2014 12:12 PM Phone (Incoming) Tomasini, Sreshta (Self) [phone] (H) Delbert, Audie Box, [place]

## 2014-04-15 LAB — LIPID PANEL
CHOLESTEROL: 114
HDL-CHOLESTEROL: 34 — ABNORMAL LOW
LDL (CALCULATED): 45
TRIGLYCERIDES: 175 — ABNORMAL HIGH

## 2014-04-15 LAB — HGA1C (HEMOGLOBIN A1C WITH EST AVG GLUCOSE): HEMOGLOBIN A1C: 5.9 — ABNORMAL HIGH

## 2014-05-10 ENCOUNTER — Encounter (HOSPITAL_BASED_OUTPATIENT_CLINIC_OR_DEPARTMENT_OTHER): Payer: Self-pay | Admitting: Interventional Cardiology

## 2014-10-13 ENCOUNTER — Ambulatory Visit (INDEPENDENT_AMBULATORY_CARE_PROVIDER_SITE_OTHER): Payer: Medicare Other | Admitting: Interventional Cardiology

## 2014-10-13 VITALS — BP 132/64 | HR 70 | Resp 18 | Ht 64.0 in | Wt 147.3 lb

## 2014-10-13 DIAGNOSIS — I255 Ischemic cardiomyopathy: Secondary | ICD-10-CM

## 2014-10-13 DIAGNOSIS — I2510 Atherosclerotic heart disease of native coronary artery without angina pectoris: Secondary | ICD-10-CM

## 2014-10-13 DIAGNOSIS — I10 Essential (primary) hypertension: Secondary | ICD-10-CM

## 2014-10-13 DIAGNOSIS — E785 Hyperlipidemia, unspecified: Secondary | ICD-10-CM

## 2014-10-25 NOTE — Progress Notes (Signed)
CARDIAC HISTORY: The patient was initially evaluated on 09/13/10 for preoperative assessment prior to a fairly low risk noncardiac surgery. A [facility]acic [facility] after that clinic visit. This echocardiogram showed a mildly reduced ejection fraction with wall motion abnormalities involving the anteroseptal wall and apex. Because the patient was fairly asymptomatic and the surgery was a low-risk procedure, it was recommended she proceed with her surgery. She did have complaints of chest pain following the surgery and was diagnosed with a non-ST segment elevation acute myocardial infarction. A transthoracic echocardiogram on 09/19/10 showed an ejection fraction of 35% with hypokinesia involving the mid to distal anteroseptal wall. The apex appears dyskinetic. There was septal dyssynergy. There is mild diastolic dysfunction mildly dilated left atrium. The right ventricular systolic pressure was mildly elevated at 45 mmHg consistent with mild pulmonary hypertension. She was started on aggressive medical therapy with resolution of her symptoms. Shortly after her hospital discharge, her symptoms returned and continued despite escalating doses of antianginal medications. The patient was brought to the cardiac catheterization laboratory on 09/29/10. Mid left anterior descending coronary artery contains a severe lesion, but the vessel tapered in size distal to the lesion. Based on the echocardiogram findings, this was felt to represent an old myocardial infarction with spontaneous recannulization of the vessel. There were multiple areas of severe disease within the right coronary system. An 80% lesion in the mid posterior lateral branch was treated with a 3.0 x 15 mm Integrity stent. A 79% lesion in the mid right coronary artery was treated with 4.0 x 12 mm Integrity stent. This was overlapped distally with a 3.5 x 12 mm Integrity stent to treat a distal edge dissection.  The proximal right coronary artery contained an 80% ulcerated plaque which was felt to be the cause of the patient's non-ST segment elevation acute myocardial infarction. This was treated with a 4.0 x 15 mm Integrity stent. Bilateral carotid artery Dopplers were performed on 04/10/11. This showed mild (less than 40%) bilateral internal carotid artery stenosis. The vertebral arteries were patent with antegrade flow.    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 She rarely has any symptoms of chest pain. She has only used 1 or 2 sublingual nitroglycerin since her last visit one year ago. Her dyspnea on exertion is stable. She states that she gets some swelling in her feet from time to time. This does extend up to the level of her ankles on occasion. The swelling usually resolves overnight and is not concerning to her. She denies any paroxysmal nocturnal dyspnea, orthopnea, palpitations, or syncope. She knows that her lisinopril dose was recently increased.    Current Outpatient Prescriptions   Medication Sig    acetaminophen (TYLENOL) 500 mg Oral Tablet take 500 mg by mouth QPM.      Ascorbic Acid (VITAMIN C) 1,000 mg Oral Tablet take 1,000 mg by mouth Once a day.    Aspirin 81 mg Oral Tablet take  by mouth Once a day.      atenolol (TENORMIN) 50 mg Oral Tablet TAKE 1 TABLET DAILY    atorvastatin (LIPITOR) 20 mg Oral Tablet Take 1 Tab (20 mg total) by mouth Every evening    Bimatoprost (BIMATOPROST) 0.03 % Ophthalmic Drops by Both Eyes route every night.    CA CARBONATE/VITAMIN D3/VIT K (CALCIUM CHEW ORAL)  take  by mouth.    dorzolamide (TRUSOPT) 2 % Ophthalmic Drops 1 Drop by Left Eye route Three times a day.    lisinopril (PRINIVIL) 10 mg Oral Tablet Take 20 mg by mouth Once a day     metFORMIN  (GLUCOPHAGE) 500 mg Oral Tablet Take 500 mg by mouth Every morning with breakfast    multivitamin Oral Tablet take 1 Tab by mouth Once a day.    NITROSTAT 0.4 mg Sublingual Tablet, Sublingual DISSOLVE 1 TAB UNDER TONGUE FOR CHEST PAIN - IF PAIN REMAINS AFTER 5 MIN, CALL 911 AND REPEAT DOSE. MAX 3 TABS IN 15 MINUTES    OMEGA-3 FATTY ACIDS (FISH OIL ORAL) Take 3 Tabs by mouth Once a day        OBJECTIVE:  Filed Vitals:    10/13/14 1059   BP: 132/64   Pulse: 70   Resp: 18   Height: 1.626 m ( )   Weight: 66.8 kg (147 lb 4.3 oz)   SpO2: 94%     General: The patient is alert and oriented and is in no acute distress.  HEENT: There is no evidence of JVD, carotid bruit, or thyromegaly.  Respiratory: The lungs are clear to auscultation bilaterally without any distinct crackles or wheezing.  Cardiac: The heart is regular without any evidence of murmur, rub, gallop, or S3.  Gastrointestinal: The abdomen is soft, nontender, and nondistended. There are normal active bowel sounds present. There is no evidence of hepatosplenomegaly, mass, or bruit.  Extremities: There is no evidence of clubbing or cyanosis. There is trace pedal edema bilaterally. The pulses in the bilateral lower extremities are normal and symmetric.    ASSESSMENT AND PLAN: The patient is a 81 y.o. lady with:  #1. Coronary artery disease.  The patient's symptoms of angina and dyspnea on exertion are stable. She is to continue her current regimen of medication including aspirin, atenolol, lisinopril, fish oil, and Lipitor. She is to return to clinic in one year or sooner as indicated by symptoms.  #2. 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 The patient continues to decline ICD placement which I feel is appropriate considering her advanced age. Her dyspnea on exertion is stable. There is no significant lower extremity swelling to warrant diuretic therapy. She is to continue her  lisinopril. I do not feel compelled to change her atenolol to a study proven beta blocker given her advanced age and lack of symptoms.  #3. Hypertension. The patient's blood pressure is fairly well controlled. She is to continue her current regimen of medication including atenolol and lisinopril. She is to follow with her primary care provider for further monitoring of her blood pressure and heart rate and titration of her medications as needed.  #4. Hyperlipidemia. I do not have a recent fasting lipid panel for review today. She should follow with her primary care provider for monitoring of her fasting lipid panel and AST once a year as she is on a statin medication. Her goal LDL cholesterol is less than 70. Given her advanced age, I would not recommend titration of her Lipitor unless the LDL cholesterol is greater than 90. She can continue her fish oil but I would not recommend adding any other cholesterol modifying medications due to the risk of interactions and side effects.  #5. Carotid artery disease. The patient decided not to follow through with her carotid ultrasound. She reiterated that she would not want to have surgery  or carotid artery stenting evening she had a significant stenosis. Given her advanced age, I feel that this is appropriate.      Marny Lowenstein, MD

## 2014-12-27 ENCOUNTER — Other Ambulatory Visit (HOSPITAL_BASED_OUTPATIENT_CLINIC_OR_DEPARTMENT_OTHER): Payer: Self-pay | Admitting: Interventional Cardiology

## 2014-12-27 DIAGNOSIS — I255 Ischemic cardiomyopathy: Secondary | ICD-10-CM

## 2014-12-27 DIAGNOSIS — I2510 Atherosclerotic heart disease of native coronary artery without angina pectoris: Secondary | ICD-10-CM

## 2014-12-27 DIAGNOSIS — E785 Hyperlipidemia, unspecified: Secondary | ICD-10-CM

## 2014-12-27 DIAGNOSIS — I10 Essential (primary) hypertension: Secondary | ICD-10-CM

## 2014-12-27 MED ORDER — ATORVASTATIN 20 MG TABLET
20.0000 mg | ORAL_TABLET | Freq: Every evening | ORAL | Status: AC
Start: 2014-12-27 — End: ?

## 2014-12-27 MED ORDER — ATENOLOL 50 MG TABLET
ORAL_TABLET | ORAL | Status: DC
Start: 2014-12-27 — End: 2016-03-08

## 2014-12-27 NOTE — Telephone Encounter (Signed)
Received faxes from [facility] for refill of this pt's atorvastatin 20 mg daily and atenolol 50 mg daily.

## 2015-11-02 ENCOUNTER — Ambulatory Visit (INDEPENDENT_AMBULATORY_CARE_PROVIDER_SITE_OTHER): Payer: Medicare Other | Admitting: Interventional Cardiology

## 2015-11-02 ENCOUNTER — Encounter (INDEPENDENT_AMBULATORY_CARE_PROVIDER_SITE_OTHER): Payer: Self-pay | Admitting: Interventional Cardiology

## 2015-11-02 VITALS — BP 158/78 | HR 78 | Resp 18 | Ht 61.0 in | Wt 150.0 lb

## 2015-11-02 DIAGNOSIS — I2510 Atherosclerotic heart disease of native coronary artery without angina pectoris: Secondary | ICD-10-CM

## 2015-11-02 DIAGNOSIS — I10 Essential (primary) hypertension: Secondary | ICD-10-CM

## 2015-11-02 DIAGNOSIS — I5042 Chronic combined systolic (congestive) and diastolic (congestive) heart failure: Secondary | ICD-10-CM

## 2015-11-02 DIAGNOSIS — E785 Hyperlipidemia, unspecified: Secondary | ICD-10-CM

## 2015-11-02 DIAGNOSIS — I255 Ischemic cardiomyopathy: Secondary | ICD-10-CM

## 2015-11-02 MED ORDER — NITROGLYCERIN 0.4 MG SUBLINGUAL TABLET
SUBLINGUAL_TABLET | SUBLINGUAL | 5 refills | Status: DC
Start: 2015-11-02 — End: 2017-08-19

## 2015-11-02 MED ORDER — LISINOPRIL 40 MG TABLET
40.0000 mg | ORAL_TABLET | Freq: Every day | ORAL | 4 refills | Status: AC
Start: 2015-11-02 — End: ?

## 2015-11-02 NOTE — Progress Notes (Signed)
See dictated note.        Etna Forquer A Naliyah Neth, MD

## 2015-11-10 DIAGNOSIS — I10 Essential (primary) hypertension: Secondary | ICD-10-CM | POA: Insufficient documentation

## 2015-11-10 DIAGNOSIS — I5032 Chronic diastolic (congestive) heart failure: Secondary | ICD-10-CM | POA: Insufficient documentation

## 2015-11-10 DIAGNOSIS — E785 Hyperlipidemia, unspecified: Secondary | ICD-10-CM | POA: Insufficient documentation

## 2015-11-10 DIAGNOSIS — I2510 Atherosclerotic heart disease of native coronary artery without angina pectoris: Secondary | ICD-10-CM | POA: Insufficient documentation

## 2015-11-10 DIAGNOSIS — I5042 Chronic combined systolic (congestive) and diastolic (congestive) heart failure: Secondary | ICD-10-CM | POA: Insufficient documentation

## 2015-11-10 NOTE — Progress Notes (Signed)
[facility]  [address]  [place], [place]  [address]                                  PATIENT NAME: Ashley, [name]  HOSPITAL MVHQIO:[id number]  DATE OF SERVICE:11/02/2015  DATE OF BIRTH: 03-20-1919      PROGRESS NOTE    CARDIAC HISTORY: The patient was initially evaluated on 09/13/10 for preoperative assessment prior to a fairly low risk noncardiac surgery. A transthoracic echocardiogram was performed and [facility] shortly after that clinic visit. This echocardiogram showed a mildly reduced ejection fraction with wall motion abnormalities involving the anteroseptal wall and apex. Because the patient was fairly asymptomatic and the surgery was a low-risk procedure, it was recommended she proceed with her surgery. She did have complaints of chest pain following the surgery and was diagnosed with a non-ST segment elevation acute myocardial infarction. A transthoracic echocardiogram on 09/19/10 showed an ejection fraction of 35% with hypokinesia involving the mid to distal anteroseptal wall. The apex appears dyskinetic. There was septal dyssynergy. There is mild diastolic dysfunction mildly dilated left atrium. The right ventricular systolic pressure was mildly elevated at 45 mmHg consistent with mild pulmonary hypertension. She was started on aggressive medical therapy with resolution of her symptoms. Shortly after her hospital discharge, her symptoms returned and continued despite escalating doses of antianginal medications. The patient was brought to the cardiac catheterization laboratory on 09/29/10. Mid left anterior descending coronary artery contains a severe lesion, but the vessel tapered in size distal to the lesion. Based on the echocardiogram findings, this was felt to represent an old myocardial infarction with spontaneous recannulization of the vessel. There were multiple areas of severe disease within the right coronary system. An 80% lesion in the mid posterior lateral branch  was treated with a 3.0 x 15 mm Integrity stent. A 70% lesion in the mid right coronary artery was treated with 4.0 x 12 mm Integrity stent. This was overlapped distally with a 3.5 x 12 mm Integrity stent to treat a distal edge dissection. The proximal right coronary artery contained an 80% ulcerated plaque which was felt to be the cause of the patient's non-ST segment elevation acute myocardial infarction. This was treated with a 4.0 x 15 mm Integrity stent. Bilateral carotid artery Dopplers were performed on 04/10/11. This showed mild (less than 40%) bilateral internal carotid artery stenosis. The vertebral arteries were patent with antegrade flow.    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  The patient denies any symptoms of chest pain, dyspnea on exertion, paroxysmal nocturnal dyspnea, orthopnea, palpitations, lower extremity edema, or syncope.  She states that she remains active within her limitations.  She denies any side effects to her medications.  She notes that her blood pressure was elevated during the examination today.  She states that it is usually between 140 and 160 mmHg systolic at home.  She notes that it has been this way for a while.  She denies any headaches, nosebleeds or other neurologic complaints.    Current Outpatient Prescriptions   Medication Sig    acetaminophen (TYLENOL) 500 mg Oral Tablet take 500 mg by mouth QPM.      Aspirin 81 mg Oral Tablet take  by mouth Once  a day.      atenolol (TENORMIN) 50 mg Oral Tablet TAKE 1 TABLET DAILY    atorvastatin (LIPITOR) 20 mg Oral Tablet Take 1 Tab (20 mg total) by mouth Every evening    Bimatoprost (LUMIGAN) 0.01 % Ophthalmic Drops Instill 1 Drop into both eyes Every night    CA CARBONATE/VITAMIN D3/VIT K (CALCIUM CHEW ORAL) take   by mouth.    lisinopril (PRINIVIL) 10 mg Oral Tablet Take 40 mg by mouth Once a day     metFORMIN (GLUCOPHAGE) 500 mg Oral Tablet Take 500 mg by mouth Every morning with breakfast    multivitamin Oral Tablet take 1 Tab by mouth Once a day.    NITROSTAT 0.4 mg Sublingual Tablet, Sublingual DISSOLVE 1 TAB UNDER TONGUE FOR CHEST PAIN - IF PAIN REMAINS AFTER 5 MIN, CALL 911 AND REPEAT DOSE. MAX 3 TABS IN 15 MINUTES    OMEGA-3 FATTY ACIDS (FISH OIL ORAL) Take 1,500 mg by mouth Once a day     PSYLLIUM SEED, WITH DEXTROSE, (FIBER ORAL) Take by mouth    timolol maleate (TIMOPTIC) 0.5 % Ophthalmic Drops 1 Drop Once a day    [DISCONTINUED] Bimatoprost (BIMATOPROST) 0.03 % Ophthalmic Drops by Both Eyes route every night.       OBJECTIVE:  Vitals:    11/02/15 1315   BP: (!) 158/78   Pulse: 78   Resp: 18   SpO2: 95%   Weight: 68 kg (150 lb)   Height: 1.549 m (5\' 1" )     General: The patient is alert and oriented and is in no acute distress.  HEENT: There is no evidence of JVD, carotid bruit, or thyromegaly.  Respiratory: The lungs are clear to auscultation bilaterally without any distinct crackles or wheezing.  Cardiac: The heart is regular without any evidence of murmur, rub, gallop, or S3.  Gastrointestinal: The abdomen is soft, nontender, and nondistended. There are normal active bowel sounds present. There is no evidence of hepatosplenomegaly, mass, or bruit. The abdomen is obese.  Extremities: There is no evidence of clubbing, cyanosis, or edema. The pulses in the bilateral lower extremities are normal and symmetric.    ASSESSMENT AND PLAN:  The patient is a 82 year old lady with:  1.  Coronary artery disease.  The patient has no symptoms of angina.  She is to continue her current regimen of medications including aspirin, atenolol, Prinivil, Lipitor and fish oil.  She is to return to clinic in 1 year or sooner as indicated by symptoms.  2.  Chronic combined systolic and diastolic, NYHA class 2 congestive heart  failure, secondary to ischemic cardiomyopathy.  The patient has no signs or symptoms of volume overload.  Her NYHA classification is essentially based on her functional capacity because of her advanced age.  I do not feel inclined to change her atenolol.  She is to continue the lisinopril as well.  She is to notify the clinic if she develops any lower extremity swelling or dyspnea, as she may require diuretic therapy at that time.  I do not feel that she is a candidate for ICD, given her advanced age.  She is in agreement with this.  3.  Hyperlipidemia.  I do not have any recent fasting lipid panel for review today.  She has been tolerating her Lipitor therapy without any side effects.  I would recommend that she continue her Lipitor 20 mg daily.  I do not feel that she needs monitoring of her fasting lipid panel or  her AST, given her advanced age.  4.  Hypertension.  The patient's blood pressure was elevated today.  She states that it has been elevated at home.  I instructed her to continue her atenolol and lisinopril at the current doses.  Given her advanced age, I do not feel that it would be necessary to lower her blood pressure far beyond her current range.  I will leave any titration of her medications at the discretion of her primary care provider.      Ricky Ala, MD  Assistant Professor, Section of Cardiology  [facility] Department of Medicine      [id number]; D: 11/10/2015 23:07:55; T: 11/10/2015 23:35:35      cc: Talmadge Chad NP      [address]       [place], [place] [address]

## 2015-11-17 LAB — LIPID PANEL
CHOLESTEROL: 128
HDL-CHOLESTEROL: 34 — ABNORMAL LOW
LDL (CALCULATED): 43
TRIGLYCERIDES: 256 — ABNORMAL HIGH
VLDL (CALCULATED): 51 — ABNORMAL HIGH

## 2015-11-17 LAB — HGA1C (HEMOGLOBIN A1C WITH EST AVG GLUCOSE): HEMOGLOBIN A1C: 6.2 — ABNORMAL HIGH

## 2015-11-25 ENCOUNTER — Encounter (HOSPITAL_COMMUNITY): Payer: Self-pay | Admitting: Interventional Cardiology

## 2016-03-08 ENCOUNTER — Other Ambulatory Visit (HOSPITAL_BASED_OUTPATIENT_CLINIC_OR_DEPARTMENT_OTHER): Payer: Self-pay | Admitting: Interventional Cardiology

## 2016-12-19 ENCOUNTER — Encounter (INDEPENDENT_AMBULATORY_CARE_PROVIDER_SITE_OTHER): Payer: Medicare Other | Admitting: Interventional Cardiology

## 2017-01-01 ENCOUNTER — Ambulatory Visit: Payer: Medicare Other | Attending: Interventional Cardiology | Admitting: Interventional Cardiology

## 2017-01-01 ENCOUNTER — Encounter (HOSPITAL_COMMUNITY): Payer: Self-pay | Admitting: Interventional Cardiology

## 2017-01-01 VITALS — BP 191/76 | HR 89 | Ht 65.0 in | Wt 164.0 lb

## 2017-01-01 DIAGNOSIS — R609 Edema, unspecified: Secondary | ICD-10-CM | POA: Insufficient documentation

## 2017-01-01 DIAGNOSIS — I5042 Chronic combined systolic (congestive) and diastolic (congestive) heart failure: Secondary | ICD-10-CM | POA: Insufficient documentation

## 2017-01-01 DIAGNOSIS — Z7982 Long term (current) use of aspirin: Secondary | ICD-10-CM | POA: Insufficient documentation

## 2017-01-01 DIAGNOSIS — Z955 Presence of coronary angioplasty implant and graft: Secondary | ICD-10-CM | POA: Insufficient documentation

## 2017-01-01 DIAGNOSIS — I10 Essential (primary) hypertension: Secondary | ICD-10-CM

## 2017-01-01 DIAGNOSIS — E785 Hyperlipidemia, unspecified: Secondary | ICD-10-CM | POA: Insufficient documentation

## 2017-01-01 DIAGNOSIS — I110 Hypertensive heart disease with heart failure: Secondary | ICD-10-CM | POA: Insufficient documentation

## 2017-01-01 DIAGNOSIS — Z6827 Body mass index (BMI) 27.0-27.9, adult: Secondary | ICD-10-CM | POA: Insufficient documentation

## 2017-01-01 DIAGNOSIS — I255 Ischemic cardiomyopathy: Secondary | ICD-10-CM

## 2017-01-01 DIAGNOSIS — I2510 Atherosclerotic heart disease of native coronary artery without angina pectoris: Secondary | ICD-10-CM | POA: Insufficient documentation

## 2017-01-01 DIAGNOSIS — Z7984 Long term (current) use of oral hypoglycemic drugs: Secondary | ICD-10-CM | POA: Insufficient documentation

## 2017-01-01 NOTE — Progress Notes (Signed)
[facility]  [address]  [place] [address]      PATIENT NAME: [name] NUMBER: [id number]  DATE OF BIRTH: [birth year]  DATE OF SERVICE: 01/01/17      CLINIC NOTE    CARDIAC HISTORY:  The patient was initially evaluated on 09/13/10 for preoperative assessment prior to a fairly low risk noncardiac surgery. A transthoracic echocardiogram was performed and [facility] shortly after that clinic visit. This echocardiogram showed a mildly reduced ejection fraction with wall motion abnormalities involving the anteroseptal wall and apex. Because the patient was fairly asymptomatic and the surgery was a low-risk procedure, it was recommended she proceed with her surgery. She did have complaints of chest pain following the surgery and was diagnosed with a non-ST segment elevation acute myocardial infarction. A transthoracic echocardiogram on 09/19/10 showed an ejection fraction of 35% with hypokinesia involving the mid to distal anteroseptal wall. The apex appears dyskinetic. There was septal dyssynergy. There is mild diastolic dysfunction mildly dilated left atrium. The right ventricular systolic pressure was mildly elevated at 45 mmHg consistent with mild pulmonary hypertension. She was started on aggressive medical therapy with resolution of her symptoms. Shortly after her [name] discharge, her symptoms returned and continued despite escalating doses of antianginal medications. The patient was brought to the cardiac catheterization laboratory on 09/29/10. Mid left anterior descending coronary artery contains a severe lesion, but the vessel tapered in size distal to the lesion. Based on the echocardiogram findings, this was felt to represent an old myocardial infarction with spontaneous recannulization of the vessel. There were multiple areas of severe disease within the right coronary system. An 80% lesion in the mid posterior lateral branch was treated with a 3.0 x 15  mm Integrity stent. A 70% lesion in the mid right coronary artery was treated with 4.0 x 12 mm Integrity stent. This was overlapped distally with a 3.5 x 12 mm Integrity stent to treat a distal edge dissection. The proximal right coronary artery contained an 80% ulcerated plaque which was felt to be the cause of the patient's non-ST segment elevation acute myocardial infarction. This was treated with a 4.0 x 15 mm Integrity stent. Bilateral carotid artery Dopplers were performed on 04/10/11. This showed mild (less than 40%) bilateral internal carotid artery stenosis. The vertebral arteries were patent with antegrade flow.    SUBJECTIVE: [name] is a 83 y.o. female presenting to the cardiac clinic for follow-up of coronary artery disease, chronic systolic and diastolic NYHA class II congestive heart failure secondary to ischemic cardiomyopathy, hyperlipidemia, and hypertension. Patient reports some shortness of breath, but is able to perform all daily activities. She doesn't check her blood pressures at home often, but when she does they are typically 130s/80s. Denies any chest pain, lightheadedness, palpitations, or syncope. She endorses edema in her bilateral feet, without associated pain. She does not believe the edema exceeds past her ankles, just the tops of her feet stay "puffy." Currently, the patient is taking 81 mg aspirin, 40 mg lisinopril, 20 mg Lipitor, 50 mg atenolol, and sublingual nitroglycerin. She is tolerating her medications well, and denies all other complaints at this time. Note, the patient no longer permanently resides in [place], but is visiting family in various locations.     Current Outpatient Prescriptions   Medication Sig   . acetaminophen (TYLENOL) 500 mg Oral Tablet take 500 mg by mouth QPM.     . Aspirin 81 mg Oral Tablet take  by  mouth Once a day.     Marland Kitchen atenolol (TENORMIN) 50 mg Oral Tablet TAKE 1 TABLET DAILY   . atorvastatin (LIPITOR) 20 mg Oral Tablet Take 1 Tab (20 mg total)  by mouth Every evening   . Bimatoprost (LUMIGAN) 0.01 % Ophthalmic Drops Instill 1 Drop into both eyes Every night   . CA CARBONATE/VITAMIN D3/VIT K (CALCIUM CHEW ORAL) take  by mouth.   Marland Kitchen lisinopril (PRINIVIL) 40 mg Oral Tablet Take 1 Tab (40 mg total) by mouth Once a day   . metFORMIN (GLUCOPHAGE) 500 mg Oral Tablet Take 500 mg by mouth Every morning with breakfast   . multivitamin Oral Tablet take 1 Tab by mouth Once a day.   . nitroGLYCERIN (NITROSTAT) 0.4 mg Sublingual Tablet, Sublingual DISSOLVE 1 TAB UNDER TONGUE FOR CHEST PAIN - IF PAIN REMAINS AFTER 5 MIN, CALL 911 AND REPEAT DOSE. MAX 3 TABS IN 15 MINUTES   . OMEGA-3 FATTY ACIDS (FISH OIL ORAL) Take 1,500 mg by mouth Once a day    . PSYLLIUM SEED, WITH DEXTROSE, (FIBER ORAL) Take by mouth   . timolol maleate (TIMOPTIC) 0.5 % Ophthalmic Drops 1 Drop Once a day       OBJECTIVE:  Vitals:    01/01/17 1434   BP: (!) 191/76   Pulse: 89   SpO2: 95%   Weight: 74.4 kg (164 lb 0.4 oz)   Height: 1.651 m (5\' 5" )     General: The patient is alert and oriented and is in no acute distress.  HEENT: There is no evidence of JVD, carotid bruit, or thyromegaly.  Respiratory: The lungs are clear to auscultation bilaterally without any distinct crackles or wheezing.  Cardiac: The heart is regular without any evidence of murmur, rub, gallop, or S3.  Gastrointestinal: The abdomen is soft, nontender, and nondistended. There are normal active bowel sounds present. There is no evidence of hepatosplenomegaly, mass, or bruit.  Extremities: There is no evidence of clubbing, cyanosis, or edema.    ASSESSMENT AND PLAN: The patient is a 83 y.o. female with:  #1.  Coronary artery disease.  The patient has no symptoms of angina.  She is to continue her current regimen of medications including aspirin, atenolol, lisinopril, Lipitor and fish oil.    #2.  Chronic combined systolic and diastolic, NYHA class 2 congestive heart failure, secondary to ischemic cardiomyopathy.  The patient has no  signs or symptoms of volume overload, but has some lower foot edema. For this, conservative lifestyle modifications such as sitting with feet propped and TEDS hose, were recommended. Her NYHA classification is essentially based on her functional capacity because of her advanced age. She is not a candidate for ICD given her advanced age, which she is in agreement with. Continue atenolol and lisinopril.   #3.  Hyperlipidemia.  I do not have any recent fasting lipid panel for review today. She has been tolerating her Lipitor therapy without any side effects.  I would recommend that she continue her Lipitor 20 mg daily.  I do not feel that she needs monitoring of her fasting lipid panel or her AST, given her advanced age.  #4.  Hypertension. Patient's blood pressure is 191/76 today.  She states that her blood pressures at home average 130s/80s. I instructed her to continue her atenolol and lisinopril at the current doses.  Given her advanced age, I do not feel that it would be necessary to lower her blood pressure far beyond her current range.  I will  leave any titration of her medications at the discretion of her primary care provider.    Return to clinic in 1 year, sooner if problems arise.       I am scribing for, and in the presence of, Dr. Leo RodMoreland for services provided on 01/01/2017.    Myer PeerAimee Chapman       I personally performed the services described in this documentation as scribed in my presence.  It is both accurate and complete.      Ricky AlaJason Cheryln Balcom, MD  Assistant Professor, Section of Cardiology  [facility]

## 2017-02-06 ENCOUNTER — Other Ambulatory Visit (HOSPITAL_COMMUNITY): Payer: Self-pay | Admitting: Family

## 2017-02-06 MED ORDER — ATENOLOL 50 MG TABLET
ORAL_TABLET | ORAL | 3 refills | Status: DC
Start: 2017-02-06 — End: 2018-01-28

## 2017-03-25 ENCOUNTER — Encounter (HOSPITAL_COMMUNITY): Payer: Self-pay | Admitting: Emergency Medicine

## 2017-03-25 ENCOUNTER — Emergency Department (HOSPITAL_COMMUNITY)
Admission: EM | Admit: 2017-03-25 | Discharge: 2017-03-25 | Disposition: A | Discharge status: Home / Self Care | Payer: Medicare Other | Attending: Emergency Medicine | Admitting: Emergency Medicine

## 2017-03-25 ENCOUNTER — Emergency Department (HOSPITAL_COMMUNITY): Payer: Medicare Other

## 2017-03-25 DIAGNOSIS — I10 Essential (primary) hypertension: Secondary | ICD-10-CM | POA: Diagnosis not present

## 2017-03-25 DIAGNOSIS — Z7984 Long term (current) use of oral hypoglycemic drugs: Secondary | ICD-10-CM | POA: Insufficient documentation

## 2017-03-25 DIAGNOSIS — Z7982 Long term (current) use of aspirin: Secondary | ICD-10-CM | POA: Insufficient documentation

## 2017-03-25 DIAGNOSIS — E119 Type 2 diabetes mellitus without complications: Secondary | ICD-10-CM | POA: Diagnosis not present

## 2017-03-25 DIAGNOSIS — K8020 Calculus of gallbladder without cholecystitis without obstruction: Secondary | ICD-10-CM | POA: Insufficient documentation

## 2017-03-25 DIAGNOSIS — Z79899 Other long term (current) drug therapy: Secondary | ICD-10-CM | POA: Diagnosis not present

## 2017-03-25 DIAGNOSIS — R1011 Right upper quadrant pain: Secondary | ICD-10-CM | POA: Diagnosis present

## 2017-03-25 HISTORY — DX: Type 2 diabetes mellitus without complications: E11.9

## 2017-03-25 HISTORY — DX: Hyperlipidemia, unspecified: E78.5

## 2017-03-25 HISTORY — DX: Essential (primary) hypertension: I10

## 2017-03-25 LAB — CBC
HCT: 37.2 % (ref 36.0–46.0)
Hemoglobin: 12.8 g/dL (ref 12.0–15.0)
MCH: 31.7 pg (ref 26.0–34.0)
MCHC: 34.4 g/dL (ref 30.0–36.0)
MCV: 92.1 fL (ref 78.0–100.0)
PLATELETS: 210 10*3/uL (ref 150–400)
RBC: 4.04 MIL/uL (ref 3.87–5.11)
RDW: 12.4 % (ref 11.5–15.5)
WBC: 8 10*3/uL (ref 4.0–10.5)

## 2017-03-25 LAB — URINALYSIS, ROUTINE W REFLEX MICROSCOPIC
BILIRUBIN URINE: NEGATIVE
GLUCOSE, UA: 150 mg/dL — AB
HGB URINE DIPSTICK: NEGATIVE
Ketones, ur: NEGATIVE mg/dL
NITRITE: NEGATIVE
PH: 7 (ref 5.0–8.0)
Protein, ur: 100 mg/dL — AB
SPECIFIC GRAVITY, URINE: 1.011 (ref 1.005–1.030)

## 2017-03-25 LAB — BASIC METABOLIC PANEL
Anion gap: 10 (ref 5–15)
BUN: 24 mg/dL — AB (ref 6–20)
CHLORIDE: 101 mmol/L (ref 101–111)
CO2: 25 mmol/L (ref 22–32)
CREATININE: 0.9 mg/dL (ref 0.44–1.00)
Calcium: 9.4 mg/dL (ref 8.9–10.3)
GFR calc Af Amer: >60 mL/min (ref >60)
GFR calc non Af Amer: 52 mL/min — ABNORMAL LOW (ref >60)
GLUCOSE: 215 mg/dL — AB (ref 65–99)
Potassium: 4.6 mmol/L (ref 3.5–5.1)
Sodium: 136 mmol/L (ref 135–145)

## 2017-03-25 LAB — HEPATIC FUNCTION PANEL
ALT: 20 U/L (ref 14–54)
AST: 21 U/L (ref 15–41)
Albumin: 4.1 g/dL (ref 3.5–5.0)
Alkaline Phosphatase: 62 U/L (ref 38–126)
BILIRUBIN DIRECT: 0.1 mg/dL (ref 0.1–0.5)
BILIRUBIN INDIRECT: 0.6 mg/dL (ref 0.3–0.9)
Total Bilirubin: 0.7 mg/dL (ref 0.3–1.2)
Total Protein: 7 g/dL (ref 6.5–8.1)

## 2017-03-25 LAB — I-STAT CG4 LACTIC ACID, ED
Lactic Acid, Venous: 2.25 mmol/L (ref 0.5–1.9)
Lactic Acid, Venous: 2.01 mmol/L (ref 0.5–1.9)

## 2017-03-25 LAB — I-STAT TROPONIN, ED
TROPONIN I, POC: 0.02 ng/mL (ref 0.00–0.08)
Troponin i, poc: 0 ng/mL (ref 0.00–0.08)

## 2017-03-25 LAB — LIPASE, BLOOD: Lipase: 39 U/L (ref 11–51)

## 2017-03-25 MED ORDER — ONDANSETRON 8 MG PO TBDP: 8.0000 mg | ORAL_TABLET | Freq: Three times a day (TID) | ORAL | 0 refills | Status: AC | PRN

## 2017-03-25 NOTE — ED Provider Notes (Signed)
[facility]-EMERGENCY DEPT Provider Note   CSN: [id number] Arrival date & time: 03/25/17  1043     History   Chief Complaint Chief Complaint  Patient presents with  . Flank Pain    HPI Crystal Rice is a 83 y.o. female.  HPI Crystal Rice is a 83 y.o. femalewith history of diabetes and hypertension, presents to emergency department complaining of right upper quadrant abdominal pain and flank pain. Patient states that her symptoms began this morning. She reports sudden onset of discomfort over her upper abdomen that radiated into the right flank. She reports associated diaphoresis, nausea, dizziness. [name] states that she looked "pale." EMS was called and given patient 4 mg of Zofran ODT. Patient states she's feeling better. [name] also states the patient has been burping and belching more than usual yesterday. Last normal bowel movement was yesterday. Patient has not had a bowel movement today. Patient denies any urinary symptoms.  Past Medical History:  Diagnosis Date  . Diabetes mellitus without complication (HCC)   . Hyperlipidemia   . Hypertension     There are no active problems to display for this patient.   No past surgical history on file.  OB History    No data available       Home Medications    Prior to Admission medications   Medication Sig Start Date End Date Taking? Authorizing Provider  acetaminophen (TYLENOL) 500 MG tablet Take 1,000 mg by mouth daily.   Yes [provider]  aspirin EC 81 MG tablet Take 81 mg by mouth daily.   Yes [provider]  atenolol (TENORMIN) 50 MG tablet Take 50 mg by mouth at bedtime.   Yes [provider]  atorvastatin (LIPITOR) 20 MG tablet Take 20 mg by mouth at bedtime.   Yes [provider]  bimatoprost (LUMIGAN) 0.01 % SOLN Place 1 drop into both eyes at bedtime.   Yes [provider]  Calcium 500 MG CHEW Chew 1 tablet by mouth daily.   Yes [provider]  FIBER PO Take 2 tablets by mouth every evening.   Yes [provider]  lisinopril (PRINIVIL,ZESTRIL) 40 MG tablet Take 40 mg by mouth daily.   Yes [provider]  metFORMIN (GLUCOPHAGE) 500 MG tablet Take 500 mg by mouth daily with breakfast.   Yes [provider]  Multiple Vitamins-Minerals (MULTIVITAMIN ADULT) TABS Take 1 tablet by mouth daily.   Yes [provider]  Omega-3 Fatty Acids (FISH OIL) 1000 MG CAPS Take 1 capsule by mouth 2 (two) times daily.   Yes [provider]  timolol (BETIMOL) 0.5 % ophthalmic solution Place 1 drop into the left eye 2 (two) times daily.   Yes [provider]    Family History No family history on file.  Social History Social History  Substance Use Topics  . Smoking status: Not on file  . Smokeless tobacco: Not on file  . Alcohol use Not on file     Allergies   Patient has no known allergies.   Review of Systems Review of Systems  Constitutional: Positive for diaphoresis. Negative for chills and fever.  Respiratory: Negative for cough, chest tightness and shortness of breath.   Cardiovascular: Negative for chest pain, palpitations and leg swelling.  Gastrointestinal: Positive for abdominal pain and nausea. Negative for constipation, diarrhea and vomiting.  Genitourinary: Negative for dysuria, flank pain, pelvic pain, vaginal bleeding, vaginal discharge and vaginal pain.  Musculoskeletal: Negative for arthralgias, myalgias, neck  pain and neck stiffness.  Skin: Negative for rash.  Neurological: Positive for dizziness and light-headedness. Negative for weakness and headaches.  All other systems reviewed and are negative.    Physical Exam Updated Vital Signs BP (!) 167/86   Pulse 63   Temp 97.6 F (36.4 C) (Oral)   Resp 15   SpO2 98%   Physical Exam  Constitutional: She is oriented to person, place, and time. She appears well-developed and well-nourished. No  distress.  HENT:  Head: Normocephalic.  Eyes: Conjunctivae are normal.  Neck: Neck supple.  Cardiovascular: Normal rate, regular rhythm and normal heart sounds.   Pulmonary/Chest: Effort normal and breath sounds normal. No respiratory distress. She has no wheezes. She has no rales.  Abdominal: Soft. Bowel sounds are normal. She exhibits no distension. There is no tenderness. There is no rebound and no guarding.  Musculoskeletal: She exhibits no edema.  Neurological: She is alert and oriented to person, place, and time.  Skin: Skin is warm and dry.  Psychiatric: She has a normal mood and affect. Her behavior is normal.  Nursing note and vitals reviewed.    ED Treatments / Results  Labs (all labs ordered are listed, but only abnormal results are displayed) Labs Reviewed  URINALYSIS, ROUTINE W REFLEX MICROSCOPIC - Abnormal; Notable for the following:       Result Value   Color, Urine STRAW (*)    Glucose, UA 150 (*)    Protein, ur 100 (*)    Leukocytes, UA MODERATE (*)    Bacteria, UA RARE (*)    Squamous Epithelial / LPF 0-5 (*)    All other components within normal limits  BASIC METABOLIC PANEL - Abnormal; Notable for the following:    Glucose, Bld 215 (*)    BUN 24 (*)    GFR calc non Af Amer 52 (*)    All other components within normal limits  I-STAT CG4 LACTIC ACID, ED - Abnormal; Notable for the following:    Lactic Acid, Venous 2.25 (*)    All other components within normal limits  I-STAT CG4 LACTIC ACID, ED - Abnormal; Notable for the following:    Lactic Acid, Venous 2.01 (*)    All other components within normal limits  URINE CULTURE  CBC  HEPATIC FUNCTION PANEL  LIPASE, BLOOD  I-STAT TROPONIN, ED  I-STAT TROPONIN, ED    EKG  EKG Interpretation  Date/Time:  Monday March 25 2017 12:42:45 EDT Ventricular Rate:  72 PR Interval:    QRS Duration: 147 QT Interval:  458 QTC Calculation: 502 R Axis:   -8 Text Interpretation:  Sinus rhythm Left bundle  branch block Confirmed by Lorre Nick ([id number]) on 03/25/2017 1:29:07 PM       Radiology Ct Renal Stone Study  Result Date: 03/25/2017 CLINICAL DATA:  Right flank pain. EXAM: CT ABDOMEN AND PELVIS WITHOUT CONTRAST TECHNIQUE: Multidetector CT imaging of the abdomen and pelvis was performed following the standard protocol without IV contrast. COMPARISON:  None. FINDINGS: Lower chest: No acute abnormality. Hepatobiliary: Large solitary gallstone is noted. No focal abnormality is seen in the liver on these unenhanced images. Pancreas: Unremarkable. No pancreatic ductal dilatation or surrounding inflammatory changes. Spleen: Normal in size without focal abnormality. Adrenals/Urinary Tract: Adrenal glands appear normal. Left renal cyst is noted. No hydronephrosis or renal obstruction is noted. No renal or ureteral calculi are noted. Urinary bladder is unremarkable. Stomach/Bowel: The stomach appears normal. Diverticulosis is noted throughout the colon without inflammation. There is no  evidence of bowel obstruction. The appendix is not visualized. Vascular/Lymphatic: Aortic atherosclerosis. No enlarged abdominal or pelvic lymph nodes. Reproductive: Status post hysterectomy. No adnexal masses. Other: No abdominal wall hernia or abnormality. No abdominopelvic ascites. Musculoskeletal: Multilevel degenerative disc disease is noted in the lumbar spine. No acute abnormality is noted. IMPRESSION: Large solitary gallstone without inflammation. Aortic atherosclerosis. No hydronephrosis or renal obstruction is noted. No renal or ureteral calculi are noted. Diffuse colonic diverticulosis is noted without inflammation. Electronically Signed   By: Lupita Raider, M.D.   On: 03/25/2017 14:37    Procedures Procedures (including critical care time)  Medications Ordered in ED Medications - No data to display   Initial Impression / Assessment and Plan / ED Course  I have reviewed the triage vital signs and the nursing  notes.  Pertinent labs & imaging results that were available during my care of the patient were reviewed by me and considered in my medical decision making (see chart for details).     Patient in emergency department with upper abdominal discomfort radiating to the right flank. Exam is unremarkable, abdomen is nontender.  3:00 PM Labs with no significant abnormalities, except for glucose of 215. Will get ct renal to ro potential kidney stone. Pt continues to be pain free. Discussed with Dr. Freida Busman who will see pt as well.    3:55 PM CT showing large solitary gallstone in the gallbladder.No hydronephrosis or renal obstruction seen. No kidney stone. Patient continues to be pain-free. Repeat troponin and lactic acid obtained which are negative. Patient is stable for discharge home. We'll give prescription for Zofran. Will give referral to general surgery for cholelithiasis. Discussed low-fat diet. Return precautions discussed at length. Discussed elevated BP, will recheck with pcp.  Vitals:   03/25/17 1330 03/25/17 1430 03/25/17 1500 03/25/17 1530  BP: (!) 165/78 (!) 183/86 (!) 174/84 (!) 165/84  Pulse: 78 73 77 75  Resp: (!) 23  Temp:      TempSrc:      SpO2: 100% 99% 98% 98%     Final Clinical Impressions(s) / ED Diagnoses   Final diagnoses:  Calculus of gallbladder without cholecystitis without obstruction    New Prescriptions New Prescriptions   ONDANSETRON (ZOFRAN ODT) 8 MG DISINTEGRATING TABLET    Take 1 tablet (8 mg total) by mouth every 8 (eight) hours as needed for nausea or vomiting.     Jaynie Crumble, PA-C 03/25/17 1600

## 2017-03-25 NOTE — ED Notes (Signed)
Spoke with main lab, they will use the blood already in the lab for lipase and HFT test.

## 2017-03-25 NOTE — Discharge Instructions (Signed)
Low fat diet. Tylenol or motrin for pain. Zofran for nausea. Follow up with general surgery. Return if worsening symptoms. Follow up with primary care doctor for recheck of your blood pressure.

## 2017-03-25 NOTE — ED Triage Notes (Signed)
Per EMS states patient woke up this am with generalized pain-states right flank pain-no dysuria-Zofran  ODT given in route-states she has PCP appointment today at 1300

## 2017-03-25 NOTE — ED Provider Notes (Signed)
Medical screening examination/treatment/procedure(s) were conducted as a shared visit with non-physician practitioner(s) and myself.  I personally evaluated the patient during the encounter.   EKG Interpretation  Date/Time:  Monday March 25 2017 12:42:45 EDT Ventricular Rate:  72 PR Interval:    QRS Duration: 147 QT Interval:  458 QTC Calculation: 502 R Axis:   -8 Text Interpretation:  Sinus rhythm Left bundle branch block Confirmed by Lorre Nick ([id number]) on 03/25/2017 1:29:20 PM     83 year old female here withcolicky right-sided flank pain. She is currently pain-free at this time. We'll CT shows very large gallstone without evidence of cholecystitis. Will be given referral to general surgeon on call.   Lorre Nick, MD 03/25/17 [phone]

## 2017-03-27 LAB — URINE CULTURE: Culture: <10000 — AB

## 2017-04-08 ENCOUNTER — Ambulatory Visit (INDEPENDENT_AMBULATORY_CARE_PROVIDER_SITE_OTHER): Payer: Medicare Other | Admitting: Neurology

## 2017-04-08 ENCOUNTER — Encounter: Payer: Self-pay | Admitting: Neurology

## 2017-04-08 DIAGNOSIS — R269 Unspecified abnormalities of gait and mobility: Secondary | ICD-10-CM | POA: Diagnosis not present

## 2017-04-08 DIAGNOSIS — R413 Other amnesia: Secondary | ICD-10-CM

## 2017-04-08 DIAGNOSIS — G459 Transient cerebral ischemic attack, unspecified: Secondary | ICD-10-CM

## 2017-04-08 NOTE — Progress Notes (Signed)
PATIENT: Crystal Rice DOB: March 10, 1919  Chief Complaint  Patient presents with  . Memory Loss    MMSE 23/30 - 10 animals.  She is here with daughter, [name], to have her short-term memory loss further evaluated.  Marland Kitchen PCP    Talmadge Chad, NP    HISTORICAL  Crystal Rice is a 83 year old female, accompanied by her daughter Rubin Payor, seen in refer by her primary care nurse practitioner [facility] for evaluation of memory loss, initial evaluation was on April 08 2017  I reviewed and summarized the referring note, she had past medical history of type 2 diabetes, vitamin D deficiency, hyperlipidemia, glaucoma, hearing difficulty, hypertension, coronary artery disease, UTI,  She recently moved in with her daughter's family, was noted to have gradual onset memory loss, she tends to forget people's name, misplace things, she also has mild hearing loss, she still enjoys reading, watching TV, knitting.   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  She also had a TIA episode in 2017, she presented with sudden onset word finding difficulties, lasting for 30 minutes,  REVIEW OF SYSTEMS: Full 14 system review of systems performed and notable only for shortness of breath, hearing loss, memory loss, confusion  ALLERGIES: Allergies  Allergen Reactions  . Promethazine     Makes her "crazy"    HOME MEDICATIONS: Current Outpatient Prescriptions  Medication Sig Dispense Refill  . acetaminophen (TYLENOL) 500 MG tablet Take 1,000 mg by mouth daily.    Marland Kitchen aspirin EC 81 MG tablet Take 81 mg by mouth daily.    Marland Kitchen atenolol (TENORMIN) 50 MG tablet Take 50 mg by mouth at bedtime.    Marland Kitchen atorvastatin  (LIPITOR) 20 MG tablet Take 20 mg by mouth at bedtime.    . bimatoprost (LUMIGAN) 0.01 % SOLN Place 1 drop into both eyes at bedtime.    . Calcium 500 MG CHEW Chew 1 tablet by mouth daily.    Marland Kitchen FIBER PO Take 2 tablets by mouth every evening.    Marland Kitchen lisinopril (PRINIVIL,ZESTRIL) 40 MG tablet Take 40 mg by mouth daily.    . metFORMIN (GLUCOPHAGE) 500 MG tablet Take 500 mg by mouth daily with breakfast.    . Multiple Vitamins-Minerals (MULTIVITAMIN ADULT) TABS Take 1 tablet by mouth daily.    . Omega-3 Fatty Acids (FISH OIL) 1000 MG CAPS Take 1 capsule by mouth 2 (two) times daily.    . ondansetron (ZOFRAN ODT) 8 MG disintegrating tablet Take 1 tablet (8 mg total) by mouth every 8 (eight) hours as needed for nausea or vomiting. 10 tablet 0  . timolol (BETIMOL) 0.5 % ophthalmic solution Place 1 drop into the left eye 2 (two) times daily.     No current facility-administered medications for this visit.     PAST MEDICAL HISTORY: Past Medical History:  Diagnosis Date  . Coronary arteriosclerosis   . Diabetes mellitus without complication (HCC)   . Fatigue   . Gall stones   . Glaucoma   . Hearing loss   . Heart attack (HCC) 2012  . Hyperlipidemia   . Hypertension   . Osteoarthritis     PAST SURGICAL HISTORY: Past Surgical History:  Procedure Laterality Date  . ABDOMINAL HYSTERECTOMY    .  ANGIOPLASTY    . ANKLE SURGERY Right   . carpal tunnel release Right   . CATARACT EXTRACTION, BILATERAL    . COLONOSCOPY    . SMALL INTESTINE SURGERY    . TONSILLECTOMY    . VAGINAL PROLAPSE REPAIR      FAMILY HISTORY: Family History  Problem Relation Age of Onset  . Stroke Mother   . Hypertension Mother   . Heart disease Mother   . Other Father        "old age"  . Diabetes Father   . Heart disease Father     SOCIAL HISTORY:  Social History   Social History  . Marital status: Widowed    Spouse name: N/A  . Number of children: 8  . Years of education: College   Occupational  History  . Retired    Social History Main Topics  . Smoking status: Never Smoker  . Smokeless tobacco: Never Used  . Alcohol use No  . Drug use: No  . Sexual activity: Not on file   Other Topics Concern  . Not on file   Social History Narrative   Lives at home with daughter and son-in-law.   Right-handed.   1 cup caffeine daily.     PHYSICAL EXAM   Vitals:   04/08/17 1410  BP: (!) 142/78  Pulse: 64  Weight: 159 lb 8 oz (72.3 kg)  Height:  (1.575 m)    Not recorded      Body mass index is 29.17 kg/m.  PHYSICAL EXAMNIATION:  Gen: NAD, conversant, well nourised, obese, well groomed                     Cardiovascular: Regular rate rhythm, no peripheral edema, warm, nontender. Eyes: Conjunctivae clear without exudates or hemorrhage Neck: Supple, no carotid bruits. Pulmonary: Clear to auscultation bilaterally   NEUROLOGICAL EXAM:  MMSE - Mini Mental State Exam 04/08/2017  Orientation to time 2  Orientation to Place 4  Registration 3  Attention/ Calculation 5  Recall 0  Language- name 2 objects 2  Language- repeat 1  Language- follow 3 step command 3  Language- read & follow direction 1  Write a sentence 1  Copy design 1  Total score 23  Animal naming 10        CRANIAL NERVES: CN II: Visual fields are full to confrontation. Fundoscopic exam is normal with sharp discs and no vascular changes. Pupils are round equal and briskly reactive to light. CN III, IV, VI: extraocular movement are normal. No ptosis. CN V: Facial sensation is intact to pinprick in all 3 divisions bilaterally. Corneal responses are intact.  CN VII: Face is symmetric with normal eye closure and smile. CN VIII: Hearing is normal to rubbing fingers CN IX, X: Palate elevates symmetrically. Phonation is normal. CN XI: Head turning and shoulder shrug are intact CN XII: Tongue is midline with normal movements and no atrophy.  MOTOR: There is no pronator drift of out-stretched arms.  Muscle bulk and tone are normal. Muscle strength is normal.Left lower extremity swelling  REFLEXES: Reflexes are 2+ and symmetric at the biceps, triceps, knees, and ankles. Plantar responses are flexor.  SENSORY: Intact to light touch, pinprick, positional sensation and vibratory sensation are intact in fingers and toes.  COORDINATION: Rapid alternating movements and fine finger movements are intact. There is no dysmetria on finger-to-nose and heel-knee-shin.    GAIT/STANCE: She needs push up to get off from seated position, rely  on her walker, cautious, unsteady Romberg is absent.   DIAGNOSTIC DATA (LABS, IMAGING, TESTING) - I reviewed patient records, labs, notes, testing and imaging myself where available.   ASSESSMENT AND PLAN  Daksha Koone is a 83 y.o. female   Mild cognitive impairment TIA in 2017 suggestive of left frontal lobe involvement  After discussed with patient and daughter, she wants to hold off MRI of brain at this point  Laboratory evaluations  Ultrasound of carotid artery, echocardiogram  Continue aspirin 81 mg daily   Levert Feinstein, M.D. Ph.D.  [facility] [address], [address] [place], [place] [address] Ph: [phone] Fax: [phone]  CC: Talmadge Chad, NP

## 2017-04-09 ENCOUNTER — Telehealth: Payer: Self-pay | Admitting: Neurology

## 2017-04-09 LAB — THYROID PANEL WITH TSH
Free Thyroxine Index: 2 (ref 1.2–4.9)
T3 Uptake Ratio: 29 % (ref 24–39)
T4, Total: 7 ug/dL (ref 4.5–12.0)
TSH: 1.62 u[IU]/mL (ref 0.450–4.500)

## 2017-04-09 LAB — VITAMIN B12: VITAMIN B 12: 587 pg/mL (ref 232–1245)

## 2017-04-09 LAB — HGB A1C W/O EAG: HEMOGLOBIN A1C: 6.7 % — AB (ref 4.8–5.6)

## 2017-04-09 NOTE — Telephone Encounter (Addendum)
Please call patient, laboratory evaluation showed elevated A1c 6.7,    have faxed the lab results to her nurse practitionerTeter, Crystal Gambles, NP,  B12 and thyroid function test was normal

## 2017-04-09 NOTE — Telephone Encounter (Signed)
Spoke to her daughter, Rubin Payor (on HIPAA) - she is aware of results and will contact her mother's PCP for further review.

## 2017-04-12 ENCOUNTER — Ambulatory Visit: Payer: Self-pay | Admitting: Surgery

## 2017-04-12 NOTE — H&P (Signed)
History of Present Illness Crystal Rice. Sherial Ebrahim MD; 04/12/2017 12:36 PM) The patient is a 83 year old female who presents for evaluation of gall stones. PCP - Dr. Juluis Rainier  This is a 83 year old female who recently moved from [place] to live with her daughter. The patient is still independent with her activities of daily living. She no longer drives. She does go for walks several days a week. She does have some short-term memory loss but physically she still seems to be doing well. She does ambulate with a rolling walker.  Recently, the patient had some right upper quadrant/right flank pain. She was evaluated in emergency department. A CT renal stone study showed a large gallstone with no sign of cholecystitis. No other significant findings. Liver function tests were normal. She is now referred for surgical evaluation.  The patient had a hysterectomy and bladder tack in 2012. She did have some cardiac events during surgery. This was all performed in [place]. Her cardiologist is Dr. Corlis Leak in [place]. She has not establish care with a cardiologist here in [place]. She is not on any blood thinners although by her daughter's report, she did have several stents placed in 2012.  CLINICAL DATA: Right flank pain.  EXAM: CT ABDOMEN AND PELVIS WITHOUT CONTRAST  TECHNIQUE: Multidetector CT imaging of the abdomen and pelvis was performed following the standard protocol without IV contrast.  COMPARISON: None.  FINDINGS: Lower chest: No acute abnormality.  Hepatobiliary: Large solitary gallstone is noted. No focal abnormality is seen in the liver on these unenhanced images.  Pancreas: Unremarkable. No pancreatic ductal dilatation or surrounding inflammatory changes.  Spleen: Normal in size without focal abnormality.  Adrenals/Urinary Tract: Adrenal glands appear normal. Left renal cyst is noted. No hydronephrosis or renal obstruction is  noted. No renal or ureteral calculi are noted. Urinary bladder is unremarkable.  Stomach/Bowel: The stomach appears normal. Diverticulosis is noted throughout the colon without inflammation. There is no evidence of bowel obstruction. The appendix is not visualized.  Vascular/Lymphatic: Aortic atherosclerosis. No enlarged abdominal or pelvic lymph nodes.  Reproductive: Status post hysterectomy. No adnexal masses.  Other: No abdominal wall hernia or abnormality. No abdominopelvic ascites.  Musculoskeletal: Multilevel degenerative disc disease is noted in the lumbar spine. No acute abnormality is noted.  IMPRESSION: Large solitary gallstone without inflammation.  Aortic atherosclerosis.  No hydronephrosis or renal obstruction is noted. No renal or ureteral calculi are noted.  Diffuse colonic diverticulosis is noted without inflammation.   Electronically Signed By: Lupita Raider, M.D. On: 03/25/2017 14:37    Past Surgical History Christianne Dolin, RMA; 04/12/2017 11:12 AM) Cataract Surgery Bilateral. Colon Polyp Removal - Open Colon Removal - Partial Foot Surgery Right. Hysterectomy (not due to cancer) - Complete Resection of Small Bowel  Diagnostic Studies History Christianne Dolin, RMA; 04/12/2017 11:12 AM) Colonoscopy >10 years ago Mammogram >3 years ago Pap Smear >5 years ago  Allergies Michel Bickers, LPN; 29/11/6211 08:65 AM) Promethazine HCl *CHEMICALS* 04/12/2017 unknown Allergies Reconciled  Medication History Michel Bickers, LPN; 78/10/6960 95:28 AM) Atenolol (  Tablet, Oral) Active. Atorvastatin Calcium (  Tablet, Oral) Active. Lisinopril (  Tablet, Oral) Active. MetFORMIN HCl (  Tablet, Oral) Active. Nitrofurantoin Monohyd Macro (  Capsule, Oral) Active. Ondansetron (  Tablet Disint, Oral) Active. Medications Reconciled Aspirin (  Tablet, Oral) Active.  Social History Christianne Dolin, [place]; 04/12/2017  11:12 AM) Caffeine use Coffee. No alcohol use No drug use Tobacco use Never smoker.  Family History Christianne Dolin, [place]; 04/12/2017 11:12 AM) Alcohol Abuse  Father. Arthritis Father. Diabetes Mellitus Father. Hypertension Mother.  Pregnancy / Birth History Christianne Dolin, [place]; 04/12/2017 11:12 AM) Age at menarche 15 years. Age of menopause 54-55 Gravida 4 Maternal age 14-25 Para 8  Other Problems Christianne Dolin, [place]; 04/12/2017 11:12 AM) Arthritis Cholelithiasis Congestive Heart Failure Diabetes Mellitus Diverticulosis General anesthesia - complications High blood pressure Hypercholesterolemia Myocardial infarction     Review of Systems Christianne Dolin RMA; 04/12/2017 11:12 AM) General Not Present- Appetite Loss, Chills, Fatigue, Fever, Night Sweats, Weight Gain and Weight Loss. Skin Not Present- Change in Wart/Mole, Dryness, Hives, Jaundice, New Lesions, Non-Healing Wounds, Rash and Ulcer. HEENT Present- Hearing Loss and Wears glasses/contact lenses. Not Present- Earache, Hoarseness, Nose Bleed, Oral Ulcers, Ringing in the Ears, Seasonal Allergies, Sinus Pain, Sore Throat, Visual Disturbances and Yellow Eyes. Respiratory Not Present- Bloody sputum, Chronic Cough, Difficulty Breathing, Snoring and Wheezing. Breast Not Present- Breast Mass, Breast Pain, Nipple Discharge and Skin Changes. Cardiovascular Present- Shortness of Breath and Swelling of Extremities. Not Present- Chest Pain, Difficulty Breathing Lying Down, Leg Cramps, Palpitations and Rapid Heart Rate. Gastrointestinal Present- Abdominal Pain and Indigestion. Not Present- Bloating, Bloody Stool, Change in Bowel Habits, Chronic diarrhea, Constipation, Difficulty Swallowing, Excessive gas, Gets full quickly at meals, Hemorrhoids, Nausea, Rectal Pain and Vomiting. Female Genitourinary Not Present- Frequency, Nocturia, Painful Urination, Pelvic Pain and Urgency. Musculoskeletal Not Present-  Back Pain, Joint Pain, Joint Stiffness, Muscle Pain, Muscle Weakness and Swelling of Extremities. Neurological Present- Decreased Memory and Trouble walking. Not Present- Fainting, Headaches, Numbness, Seizures, Tingling, Tremor and Weakness. Psychiatric Not Present- Anxiety, Bipolar, Change in Sleep Pattern, Depression, Fearful and Frequent crying.   Physical Exam Molli Hazard K. Ewald Beg MD; 04/12/2017 12:36 PM)  The physical exam findings are as follows: Note:Elderly female in NAD Eyes: Pupils equal, round; sclera anicteric HENT: Oral mucosa moist; good dentition Neck: No masses palpated, no thyromegaly Lungs: CTA bilaterally; normal respiratory effort CV: Regular rate and rhythm; no murmurs; extremities well-perfused with no edema Abd: +bowel sounds, soft, non-tender, no palpable organomegaly; no palpable hernias Skin: Warm, dry; no sign of jaundice Psychiatric - alert and oriented x 4; calm mood and affect    Assessment & Plan Molli Hazard K. Sansa Alkema MD; 04/12/2017 11:43 AM)  CHRONIC CHOLECYSTITIS WITH CALCULUS (K80.10)  Current Plans Schedule for Surgery - Laparoscopic cholecystectomy with intraoperative cholangiogram. The surgical procedure has been discussed with the patient. Potential risks, benefits, alternative treatments, and expected outcomes have been explained. All of the patient's questions at this time have been answered. The likelihood of reaching the patient's treatment goal is good. The patient understand the proposed surgical procedure and wishes to proceed.   Cardiac clearance first. Also recommend establishing cardiology care in [place] K. Corliss Skains, MD, [facility]  General/ Trauma Surgery  04/12/2017 12:37 PM

## 2017-05-07 ENCOUNTER — Other Ambulatory Visit (HOSPITAL_COMMUNITY): Payer: Medicare Other

## 2017-05-07 ENCOUNTER — Encounter (HOSPITAL_COMMUNITY): Payer: Medicare Other

## 2017-05-10 ENCOUNTER — Ambulatory Visit (HOSPITAL_BASED_OUTPATIENT_CLINIC_OR_DEPARTMENT_OTHER)
Admission: RE | Admit: 2017-05-10 | Discharge: 2017-05-10 | Disposition: A | Discharge status: Home / Self Care | Payer: Medicare Other | Source: Ambulatory Visit | Attending: Neurology | Admitting: Neurology

## 2017-05-10 ENCOUNTER — Ambulatory Visit (HOSPITAL_COMMUNITY)
Admission: RE | Admit: 2017-05-10 | Discharge: 2017-05-10 | Disposition: A | Discharge status: Home / Self Care | Payer: Medicare Other | Source: Ambulatory Visit | Attending: Neurology | Admitting: Neurology

## 2017-05-10 DIAGNOSIS — R269 Unspecified abnormalities of gait and mobility: Secondary | ICD-10-CM | POA: Diagnosis not present

## 2017-05-10 DIAGNOSIS — E785 Hyperlipidemia, unspecified: Secondary | ICD-10-CM | POA: Insufficient documentation

## 2017-05-10 DIAGNOSIS — I081 Rheumatic disorders of both mitral and tricuspid valves: Secondary | ICD-10-CM | POA: Insufficient documentation

## 2017-05-10 DIAGNOSIS — G459 Transient cerebral ischemic attack, unspecified: Secondary | ICD-10-CM

## 2017-05-10 DIAGNOSIS — R413 Other amnesia: Secondary | ICD-10-CM | POA: Insufficient documentation

## 2017-05-10 DIAGNOSIS — I119 Hypertensive heart disease without heart failure: Secondary | ICD-10-CM | POA: Diagnosis not present

## 2017-05-10 DIAGNOSIS — E119 Type 2 diabetes mellitus without complications: Secondary | ICD-10-CM | POA: Diagnosis not present

## 2017-05-10 DIAGNOSIS — I6523 Occlusion and stenosis of bilateral carotid arteries: Secondary | ICD-10-CM | POA: Insufficient documentation

## 2017-05-10 NOTE — Progress Notes (Signed)
  Echocardiogram 2D Echocardiogram has been performed.  Crystal SavoyCasey N Brandom [name] 05/10/2017, 1:45 PM

## 2017-05-10 NOTE — Progress Notes (Addendum)
Carotid duplex completed. 1% to 39% ICA stenosis. Vertebral artery flow is antegrade  [place] Shatika Grinnell, RVS 05/10/2017 2:45 PM

## 2017-05-13 ENCOUNTER — Telehealth: Payer: Self-pay | Admitting: *Deleted

## 2017-05-13 NOTE — Telephone Encounter (Signed)
Left another message requesting a return call. 

## 2017-05-13 NOTE — Telephone Encounter (Signed)
Disregard duplicate note

## 2017-05-13 NOTE — Telephone Encounter (Signed)
Pt's daughter returned RN's call. She does have some questions. Please call

## 2017-05-13 NOTE — Telephone Encounter (Signed)
Spoke to patient's dgt on HIPAA - she is aware of the ECHO results.

## 2017-05-13 NOTE — Telephone Encounter (Signed)
Left message for patient's daughter, Janace Hoarddith Votta (on HIPAA), requesting a return call to discuss results.

## 2017-05-13 NOTE — Telephone Encounter (Signed)
Left patient a detailed message, with results, on voicemail (ok per DPR).  Provided our number to call back with any questions.  

## 2017-05-13 NOTE — Telephone Encounter (Signed)
-----   Message from Levert FeinsteinYijun Yan, MD sent at 05/13/2017  3:52 PM EST ----- Please call patient for mild bilateral internal carotid artery stenosis, less than 39%.

## 2017-05-13 NOTE — Telephone Encounter (Signed)
-----   Message from Levert FeinsteinYijun Yan, MD sent at 05/13/2017 10:07 AM EST ----- Please call patient for normal ECHO

## 2017-05-14 NOTE — Telephone Encounter (Signed)
Spoke to patient's daughter on HIPAA - she is aware of the results.

## 2017-06-26 ENCOUNTER — Ambulatory Visit (INDEPENDENT_AMBULATORY_CARE_PROVIDER_SITE_OTHER): Payer: Medicare Other | Admitting: Podiatry

## 2017-06-26 ENCOUNTER — Encounter: Payer: Self-pay | Admitting: Podiatry

## 2017-06-26 DIAGNOSIS — M2042 Other hammer toe(s) (acquired), left foot: Secondary | ICD-10-CM

## 2017-06-26 DIAGNOSIS — B351 Tinea unguium: Secondary | ICD-10-CM | POA: Diagnosis not present

## 2017-06-26 DIAGNOSIS — M79675 Pain in left toe(s): Secondary | ICD-10-CM

## 2017-06-26 DIAGNOSIS — M2041 Other hammer toe(s) (acquired), right foot: Secondary | ICD-10-CM

## 2017-06-26 DIAGNOSIS — M79674 Pain in right toe(s): Secondary | ICD-10-CM | POA: Diagnosis not present

## 2017-06-26 NOTE — Progress Notes (Signed)
Subjective:   Patient ID: Crystal LopeIrene Rice, female   DOB: 83 y.o.   MRN: [id number]   HPI Patient presents with caregiver stating she has got very thick nails that she cannot take care of herself and she has diabetes and is concerned about nail fungus.  Also complains of digital deformities and patient has never smoked and is not currently active   Review of Systems  All other systems reviewed and are negative.       Objective:  Physical Exam  Constitutional: She appears well-developed and well-nourished.  Cardiovascular: Intact distal pulses.  Pulmonary/Chest: Effort normal.  Musculoskeletal: Normal range of motion.  Neurological: She is alert.  Skin: Skin is warm.  Nursing note and vitals reviewed.   Neurovascular status found to be mildly impaired but intact and normal for her age with diminished range of motion and muscle strength bilateral.  Patient is noted to have significant thickness of nailbeds 1-5 both feet with aggravation of the beds pain and has distal keratotic lesions with digital deformity noted bilateral.  Patient does have good digital perfusion well oriented x3     Assessment:  Mycotic nail infection with pain 1-5 both feet with digital deformity noted bilateral and mycotic keratotic tissue formation bilateral     Plan:  H&P conditions reviewed diabetic education rendered debridement of nail beds painful 1-5 both feet and lesions and discussed hammertoe deformity and do not recommend treatment currently for this

## 2017-06-26 NOTE — Progress Notes (Signed)
   Subjective:    Patient ID: Crystal LopeIrene Sokolov, female    DOB: March 15, 1919, 83 y.o.   MRN: [id number]  HPI    Review of Systems  All other systems reviewed and are negative.      Objective:   Physical Exam        Assessment & Plan:

## 2017-08-14 ENCOUNTER — Telehealth: Payer: Self-pay | Admitting: Neurology

## 2017-08-14 MED ORDER — MEMANTINE HCL 10 MG PO TABS: 10.0000 mg | ORAL_TABLET | Freq: Two times a day (BID) | ORAL | 3 refills | Status: DC

## 2017-08-14 MED ORDER — DONEPEZIL HCL 10 MG PO TABS: 10.0000 mg | ORAL_TABLET | Freq: Every day | ORAL | 3 refills | Status: DC

## 2017-08-14 NOTE — Telephone Encounter (Signed)
Per vo by Dr. Terrace ArabiaYan, if patient is ready to start medications, she will provide the following:  1) memantine 10mg , one tablet BID - take for one month then start  2) donepezil 10mg , one tablet daily  Left message requesting patient's daughter to return my call.

## 2017-08-14 NOTE — Telephone Encounter (Signed)
Spoke to her daughter.  She is agreeable to the plan below and verbalized understanding.

## 2017-08-14 NOTE — Telephone Encounter (Signed)
Pt's daughter called she said at the OV Dr Terrace ArabiaYan had wanted to wait to start a medication for memory. Daughter said there they are ready to start the memory medication, the issue has resolved. Please call to advise

## 2017-08-19 ENCOUNTER — Other Ambulatory Visit (HOSPITAL_COMMUNITY): Payer: Self-pay | Admitting: Interventional Cardiology

## 2017-08-19 DIAGNOSIS — I2510 Atherosclerotic heart disease of native coronary artery without angina pectoris: Secondary | ICD-10-CM

## 2017-08-19 MED ORDER — NITROGLYCERIN 0.4 MG SUBLINGUAL TABLET
SUBLINGUAL_TABLET | SUBLINGUAL | 3 refills | Status: DC
Start: 2017-08-19 — End: 2018-03-28

## 2017-09-25 ENCOUNTER — Telehealth: Payer: Self-pay | Admitting: Neurology

## 2017-09-25 NOTE — Telephone Encounter (Signed)
I have spoke to her daughter to let her know it is okay to wait on the donepezil.  Her mother is away visiting with her family for the next two weeks.  She does not want to start a new medication while on vacation.  She will start the donepezil when she returns home.

## 2017-09-25 NOTE — Telephone Encounter (Signed)
Pts daughter called wanting to know if pt can wait 2 weeks before starting Aricept. Crystal Rice is wanting to wait till she will be around the pt incase of any s/e. Please call to advise

## 2017-09-30 ENCOUNTER — Other Ambulatory Visit: Payer: Medicare Other

## 2017-10-02 ENCOUNTER — Encounter: Payer: Self-pay | Admitting: Podiatry

## 2017-10-02 ENCOUNTER — Ambulatory Visit (INDEPENDENT_AMBULATORY_CARE_PROVIDER_SITE_OTHER): Payer: Medicare Other | Admitting: Podiatry

## 2017-10-02 DIAGNOSIS — M2041 Other hammer toe(s) (acquired), right foot: Secondary | ICD-10-CM

## 2017-10-02 DIAGNOSIS — B351 Tinea unguium: Secondary | ICD-10-CM

## 2017-10-02 DIAGNOSIS — M2042 Other hammer toe(s) (acquired), left foot: Secondary | ICD-10-CM | POA: Diagnosis not present

## 2017-10-02 DIAGNOSIS — M79674 Pain in right toe(s): Secondary | ICD-10-CM | POA: Diagnosis not present

## 2017-10-02 DIAGNOSIS — M79675 Pain in left toe(s): Secondary | ICD-10-CM | POA: Diagnosis not present

## 2017-10-03 NOTE — Progress Notes (Signed)
Subjective:   Patient ID: Crystal LopeIrene Rice, female   DOB: 84 y.o.   MRN: [id number]   HPI Patient presents with elongated nails 1-5 both feet that are thick brittle and they become painful and she cannot cut   ROS      Objective:  Physical Exam  Neurovascular unchanged with thick yellow brittle nailbeds 1-5 both feet     Assessment:  Mycotic nail infection with pain 1-5 both feet     Plan:  Debride painful nailbeds 1-5 both feet with no iatrogenic bleeding noted

## 2017-10-14 ENCOUNTER — Telehealth: Payer: Self-pay | Admitting: Neurology

## 2017-10-14 NOTE — Telephone Encounter (Signed)
Pts daughter called stating that yesterday along with today the pt has been feeling dizzy, "not herself", stating that she has been "sitting and starring", also having diarrhea. Pts daughter thing these are s/e from donepezil (ARICEPT) 10 MG tablet

## 2017-10-14 NOTE — Telephone Encounter (Signed)
Spoke to her daughter (on [place]) - reports her mother started donepezil 10mg  one night ago.  She accidentally took two tablets instead of one and went to bed.  Upon waking, she was experiencing dizziness and diarrhea.  Additionally, the patient has developed a cold and feels bad with it as well.  She is going to stop the donepezil until the cold is better and the dizziness/diarrhea have resolved.  She will restart it at 5mg  qhs for a few days to see how the medication is tolerated then increase it to 10mg , if able.  She will keep us updated on her progress.

## 2017-10-28 NOTE — Telephone Encounter (Signed)
[name] calling stating even after taking 1/2 tablet of Aricept patient is having diarrhea. I advised Dr. Terrace ArabiaYan is out of the office and will send ,message to her nurse.

## 2017-10-28 NOTE — Telephone Encounter (Signed)
Spoke to patient's daughter - her mother started experiencing diarrhea again after restarting donepezil.  She will stop this medication.  She is still taking her memantine.  She has a pending follow up on 12/26/17.

## 2017-12-26 ENCOUNTER — Ambulatory Visit: Payer: Self-pay | Admitting: Neurology

## 2017-12-30 ENCOUNTER — Other Ambulatory Visit: Payer: Self-pay | Admitting: Neurology

## 2018-01-02 ENCOUNTER — Other Ambulatory Visit: Payer: Medicare Other

## 2018-01-14 ENCOUNTER — Telehealth: Payer: Self-pay | Admitting: Neurology

## 2018-01-14 NOTE — Telephone Encounter (Signed)
I called pt's daughter, the pt will be out of town that week. I offered the next available 8/21 @ 1:00. She is able to come to that appt. Is that ok, if so please schedule.
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
## 2018-01-14 NOTE — Telephone Encounter (Signed)
She has been placed on Dr. Zannie CoveYan's schedule for 02/26/18 at 1pm - arrival time of 12:30pm.

## 2018-01-15 ENCOUNTER — Other Ambulatory Visit: Payer: Medicare Other

## 2018-01-20 ENCOUNTER — Ambulatory Visit (INDEPENDENT_AMBULATORY_CARE_PROVIDER_SITE_OTHER): Payer: Medicare Other | Admitting: Podiatry

## 2018-01-20 DIAGNOSIS — M2042 Other hammer toe(s) (acquired), left foot: Secondary | ICD-10-CM

## 2018-01-20 DIAGNOSIS — M79674 Pain in right toe(s): Secondary | ICD-10-CM | POA: Diagnosis not present

## 2018-01-20 DIAGNOSIS — B351 Tinea unguium: Secondary | ICD-10-CM

## 2018-01-20 DIAGNOSIS — M2041 Other hammer toe(s) (acquired), right foot: Secondary | ICD-10-CM

## 2018-01-20 DIAGNOSIS — M79675 Pain in left toe(s): Secondary | ICD-10-CM

## 2018-01-22 NOTE — Progress Notes (Signed)
Subjective:   Patient ID: Crystal Rice, female   DOB: 84 y.o.   MRN: [id number]   HPI Patient presents with elongated nailbeds 1-5 both feet that become tender and she cannot cut because of thickness and advanced age   ROS      Objective:  Physical Exam  Neurovascular status intact with thick yellow brittle nailbeds 1-5 both feet     Assessment:  Mycotic nail infection with pain 1-5 both feet     Plan:  Debride painful nailbeds 1-5 both feet with no iatrogenic bleeding

## 2018-01-28 ENCOUNTER — Other Ambulatory Visit (HOSPITAL_COMMUNITY): Payer: Self-pay | Admitting: Family

## 2018-01-29 ENCOUNTER — Telehealth: Payer: Self-pay | Admitting: Neurology

## 2018-01-31 ENCOUNTER — Other Ambulatory Visit: Payer: Self-pay | Admitting: Neurology

## 2018-02-03 MED ORDER — MEMANTINE HCL 10 MG PO TABS: 10.0000 mg | ORAL_TABLET | Freq: Two times a day (BID) | ORAL | 0 refills | Status: DC

## 2018-02-03 NOTE — Telephone Encounter (Signed)
[facility] with [facility] [place] states did not get Rx request for memantine (NAMENDA) 10 MG tablet. Please resend.

## 2018-02-03 NOTE — Addendum Note (Signed)
Addended by: Lindell SparKIRKMAN, Reade Trefz C on: 02/03/2018 04:56 PM   Modules accepted: Orders

## 2018-02-03 NOTE — Telephone Encounter (Signed)
Refill sent to pharmacy.   

## 2018-02-05 ENCOUNTER — Encounter (INDEPENDENT_AMBULATORY_CARE_PROVIDER_SITE_OTHER): Payer: Self-pay | Admitting: Interventional Cardiology

## 2018-02-05 ENCOUNTER — Ambulatory Visit (INDEPENDENT_AMBULATORY_CARE_PROVIDER_SITE_OTHER): Payer: Medicare Other | Admitting: Interventional Cardiology

## 2018-02-05 VITALS — BP 115/53 | HR 69 | Resp 18 | Ht 62.0 in | Wt 156.6 lb

## 2018-02-05 DIAGNOSIS — I10 Essential (primary) hypertension: Secondary | ICD-10-CM

## 2018-02-05 DIAGNOSIS — E785 Hyperlipidemia, unspecified: Secondary | ICD-10-CM

## 2018-02-05 DIAGNOSIS — I5042 Chronic combined systolic (congestive) and diastolic (congestive) heart failure: Secondary | ICD-10-CM

## 2018-02-05 DIAGNOSIS — I2510 Atherosclerotic heart disease of native coronary artery without angina pectoris: Principal | ICD-10-CM

## 2018-02-05 NOTE — Nursing Note (Signed)
medications compared to patient list

## 2018-02-10 ENCOUNTER — Encounter (INDEPENDENT_AMBULATORY_CARE_PROVIDER_SITE_OTHER): Payer: Self-pay | Admitting: Interventional Cardiology

## 2018-02-10 NOTE — Progress Notes (Signed)
[facility] HEART & VASCULAR [place], [place]  [address]  [place] [address]  [phone]      PATIENT NAME:  [name] NUMBER:  [id number]  DATE OF BIRTH:  1918-08-21  DATE OF SERVICE:  02/05/2018      OFFICE NOTE    CARDIAC HISTORY: The patient was initially evaluated on 09/13/10 for preoperative assessment prior to a fairly low risk noncardiac surgery. A transthoracic echocardiogram was performed and [facility] shortly after that clinic visit. This echocardiogram showed a mildly reduced ejection fraction with wall motion abnormalities involving the anteroseptal wall and apex. Because the patient was fairly asymptomatic and the surgery was a low-risk procedure, it was recommended she proceed with her surgery. She did have complaints of chest pain following the surgery and was diagnosed with a non-ST segment elevation acute myocardial infarction. A transthoracic echocardiogram on 09/19/10 showed an ejection fraction of 35% with hypokinesia involving the mid to distal anteroseptal wall. The apex appears dyskinetic. There was septal dyssynergy. There is mild diastolic dysfunction mildly dilated left atrium. The right ventricular systolic pressure was mildly elevated at 45 mmHg consistent with mild pulmonary hypertension. She was started on aggressive medical therapy with resolution of her symptoms. Shortly after her hospital discharge, her symptoms returned and continued despite escalating doses of antianginal medications. The patient was brought to the cardiac catheterization laboratory on 09/29/10. Mid left anterior descending coronary artery contains a severe lesion, but the vessel tapered in size distal to the lesion. Based on the echocardiogram findings, this was felt to represent an old myocardial infarction with spontaneous recannulization of the vessel. There were multiple areas of severe disease within the right coronary system. An 80% lesion in the mid posterior lateral branch was  treated with a 3.0 x 15 mm Integrity stent. A 70% lesion in the mid right coronary artery was treated with 4.0 x 12 mm Integrity stent. This was overlapped distally with a 3.5 x 12 mm Integrity stent to treat a distal edge dissection. The proximal right coronary artery contained an 80% ulcerated plaque which was felt to be the cause of the patient's non-ST segment elevation acute myocardial infarction. This was treated with a 4.0 x 15 mm Integrity stent. Bilateral carotid artery Dopplers were performed on 04/10/11. This showed mild (less than 40%) bilateral internal carotid artery stenosis. The vertebral arteries were patent with antegrade flow.    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  She reports overall to be doing very well.  She feels that her shortness of breath is at baseline.  She is not having any chest discomfort concerning for angina.  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  She has been living in [place] for the past several months with her daughter.  She does report that she has been getting some medical care there and was seen by Neurology.  She underwent echocardiogram and carotid artery disease.  Her carotid artery disease showed mild bilateral stenosis.  Her echocardiogram showed an EF of 60-65% with moderate TR and a PA pressure of 48 mmHg.    MEDICATIONS:   Current Outpatient Medications   Medication Sig   . acetaminophen (TYLENOL) 500 mg Oral Tablet take 500 mg by  mouth QPM.     . Aspirin 81 mg Oral Tablet take  by mouth Once a day.     Marland Kitchen. atenolol (TENORMIN) 50 mg Oral Tablet TAKE 1 TABLET DAILY   . atorvastatin (LIPITOR) 20 mg Oral Tablet Take 1 Tab (20 mg total) by mouth  Every evening   . Bimatoprost (LUMIGAN) 0.01 % Ophthalmic Drops Instill 1 Drop into both eyes Every night   . CA CARBONATE/VITAMIN D3/VIT K (CALCIUM CHEW ORAL) take  by mouth.   Marland Kitchen. lisinopril (PRINIVIL) 40 mg Oral Tablet Take 1 Tab (40 mg total) by mouth Once a day   . memantine (NAMENDA) 10 mg Oral Tablet Take 10 mg by mouth Once a day   . metFORMIN (GLUCOPHAGE) 500 mg Oral Tablet Take 500 mg by mouth Every morning with breakfast   . multivitamin Oral Tablet take 1 Tab by mouth Once a day.   . nitroGLYCERIN (NITROSTAT) 0.4 mg Sublingual Tablet, Sublingual DISSOLVE 1 TAB UNDER TONGUE FOR CHEST PAIN - IF PAIN REMAINS AFTER 5 MIN, CALL 911 AND REPEAT DOSE. MAX 3 TABS IN 15 MINUTES   . OMEGA-3 FATTY ACIDS (FISH OIL ORAL) Take 1,500 mg by mouth Once a day    . ondansetron (ZOFRAN ODT) 8 mg Oral Tablet, Rapid Dissolve Take 8 mg by mouth Every 8 hours as needed   . PSYLLIUM SEED, WITH DEXTROSE, (FIBER ORAL) Take by mouth   . timolol maleate (TIMOPTIC) 0.5 % Ophthalmic Drops 1 Drop Once a day     OBJECTIVE:  BP (!) 115/53   Pulse 69   Resp 18   Ht 1.575 m (5\' 2" )   Wt 71 kg (156 lb 9.6 oz)   SpO2 94%   BMI 28.64 kg/m   In general, no acute distress, appears stated age.  Neck is without JVD or carotid bruit. Lungs are clear to auscultation bilaterally with normal thoracic expansion. Heart is a regular rate and rhythm with no appreciable murmur. Abdomen soft, non-tender with normoactive bowel sounds. Extremities are without cyanosis or edema.  Skin shows no visible rashes or lesions on exposed areas.  Neurologically grossly normal, alert and oriented.  Psych normal affect behavior and speech.    ASSESSMENT AND PLAN:  1. Coronary artery disease:  Clinically stable and asymptomatic without symptoms of angina she remains on aspirin and Lipitor she will continue these at their current doses as well as atenolol and lisinopril.  2. Chronic combined systolic and diastolic NYHA class 2 congestive heart failure secondary to  ischemic cardiomyopathy:  The patient is euvolemic on exam today.  She remains on guideline driven medical therapy with lisinopril and atenolol.  She is not requiring any diuretic therapy.  3. Hypertension:  Blood pressure is well controlled on exam.  She remains on atenolol and lisinopril.  4. Hyperlipidemia:  Continue Lipitor at present dose.  Given her advanced age, I do not think that it is necessary to continue annual lipid checks on this patient at this time.    Return to clinic in 1 year, sooner if any problems arise or symptoms develop.    A portion of this documentation may have been generated using MMODAL voice recognition software and may contain syntax/voice recognition errors.       Kandis MannanHannah Joy [place], APRN,NP-C      I have seen and evaluated the patient jointly with Silverio LayHannah Joy [place], APRN, NP-C and agree with her assessment, recommendations and plans.  I confirmed that the patient's heart was in regular  rhythm during my physical examination.    Marny Lowenstein, MD  Assistant Professor, Section of Cardiology   [facility] Department of Medicine

## 2018-02-26 ENCOUNTER — Ambulatory Visit (INDEPENDENT_AMBULATORY_CARE_PROVIDER_SITE_OTHER): Payer: Medicare Other | Admitting: Neurology

## 2018-02-26 ENCOUNTER — Encounter: Payer: Self-pay | Admitting: Neurology

## 2018-02-26 VITALS — BP 156/70 | HR 67 | Ht 62.0 in | Wt 162.0 lb

## 2018-02-26 DIAGNOSIS — R413 Other amnesia: Secondary | ICD-10-CM | POA: Diagnosis not present

## 2018-02-26 DIAGNOSIS — R269 Unspecified abnormalities of gait and mobility: Secondary | ICD-10-CM | POA: Diagnosis not present

## 2018-02-26 MED ORDER — MEMANTINE HCL 10 MG PO TABS: 10.0000 mg | ORAL_TABLET | Freq: Two times a day (BID) | ORAL | 4 refills | Status: DC

## 2018-02-26 NOTE — Progress Notes (Signed)
PATIENT: Crystal Rice DOB: 04-May-1919  Chief Complaint  Patient presents with  . New Patient (Initial Visit)    EMG room 4 w/ daughter, Rubin Payor. Last seen 04/08/17. Here to f/u on memory.  Marland Kitchen PCP    Kennith Center, NP  . Memory Loss    Last MMSE 23/30. Today's MMSE 21/30.   . Gait Problem    Ambulates with walker. No falls in the last year.     HISTORICAL  Crystal Rice is a 84 year old female, accompanied by her daughter Rubin Payor, seen in refer by her primary care nurse practitioner Kennith Center for evaluation of memory loss, initial evaluation was on April 08 2017  I reviewed and summarized the referring note, she had past medical history of type 2 diabetes, vitamin D deficiency, hyperlipidemia, glaucoma, hearing difficulty, hypertension, coronary artery disease, UTI,  She recently moved in with her daughter's family, was noted to have gradual onset memory loss, she tends to forget people's name, misplace things, she also has mild hearing loss, she still enjoys reading, watching TV, knitting.   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  She also had a TIA episode in 2017, she presented with sudden onset word finding difficulties, lasting for 30 minutes,  UPDATE February 26 2018: Ultrasound of carotid arteries showed less than 39% stenosis of bilateral internal carotid artery  Echocardiogram showed ejection fraction of 60 to 65%, wall motion was normal  Laboratory evaluation in October 2018 showed A1c was mildly elevated 6.7, normal or negative thyroid panel, B12, troponin, lipase, CBC, hepatic functional test  She was started on Namenda 10 mg twice a day, cannot tolerate Aricept due to GI  side effect.  REVIEW OF SYSTEMS: Full 14 system review of systems performed and notable only for hearing loss, leg swelling, walking difficulties  ALLERGIES: Allergies  Allergen Reactions  . Aricept [Donepezil]     Stomach upset, diarrhea  . Promethazine     Makes her "crazy"    HOME MEDICATIONS: Current Outpatient Medications  Medication Sig Dispense Refill  . acetaminophen (TYLENOL) 500 MG tablet Take 1,000 mg by mouth daily.    Marland Kitchen aspirin EC 81 MG tablet Take 81 mg by mouth daily.    Marland Kitchen atenolol (TENORMIN) 50 MG tablet Take 50 mg by mouth at bedtime.    Marland Kitchen atorvastatin (LIPITOR) 20 MG tablet Take 20 mg by mouth at bedtime.    . bimatoprost (LUMIGAN) 0.01 % SOLN Place 1 drop into both eyes at bedtime.    . Calcium 500 MG CHEW Chew 1 tablet by mouth daily.    Marland Kitchen FIBER PO Take 2 tablets by mouth every evening.    Marland Kitchen lisinopril (PRINIVIL,ZESTRIL) 40 MG tablet Take 40 mg by mouth daily.    . memantine (NAMENDA) 10 MG tablet Take 1 tablet (10 mg total) by mouth 2 (two) times daily. 180 tablet 0  . metFORMIN (GLUCOPHAGE) 500 MG tablet Take 500 mg by mouth daily with breakfast.    . Multiple Vitamins-Minerals (MULTIVITAMIN ADULT) TABS Take 1 tablet by mouth daily.    . Omega-3 Fatty Acids (FISH OIL) 1000 MG CAPS Take 1 capsule by mouth 2 (two) times daily.    . ondansetron (ZOFRAN ODT) 8 MG disintegrating tablet  Take 1 tablet (8 mg total) by mouth every 8 (eight) hours as needed for nausea or vomiting. 10 tablet 0  . timolol (BETIMOL) 0.5 % ophthalmic solution Place 1 drop into the left eye 2 (two) times daily.     No current facility-administered medications for this visit.     PAST MEDICAL HISTORY: Past Medical History:  Diagnosis Date  . Coronary arteriosclerosis   . Diabetes mellitus without complication (HCC)   . Fatigue   . Gall stones   . Glaucoma   . Hearing loss   . Heart attack (HCC) 2012  . Hyperlipidemia   . Hypertension   . Osteoarthritis     PAST SURGICAL  HISTORY: Past Surgical History:  Procedure Laterality Date  . ABDOMINAL HYSTERECTOMY    . ANGIOPLASTY    . ANKLE SURGERY Right   . carpal tunnel release Right   . CATARACT EXTRACTION, BILATERAL    . COLONOSCOPY    . SMALL INTESTINE SURGERY    . TONSILLECTOMY    . VAGINAL PROLAPSE REPAIR      FAMILY HISTORY: Family History  Problem Relation Age of Onset  . Stroke Mother   . Hypertension Mother   . Heart disease Mother   . Other Father        "old age"  . Diabetes Father   . Heart disease Father     SOCIAL HISTORY:  Social History   Socioeconomic History  . Marital status: Widowed    Spouse name: Not on file  . Number of children: 8  . Years of education: College  . Highest education level: Not on file  Occupational History  . Occupation: Retired  Engineer, productionocial Needs  . Financial resource strain: Not on file  . Food insecurity:    Worry: Not on file    Inability: Not on file  . Transportation needs:    Medical: Not on file    Non-medical: Not on file  Tobacco Use  . Smoking status: Never Smoker  . Smokeless tobacco: Never Used  Substance and Sexual Activity  . Alcohol use: No  . Drug use: No  . Sexual activity: Not on file  Lifestyle  . Physical activity:    Days per week: Not on file    Minutes per session: Not on file  . Stress: Not on file  Relationships  . Social connections:    Talks on phone: Not on file    Gets together: Not on file    Attends religious service: Not on file    Active member of club or organization: Not on file    Attends meetings of clubs or organizations: Not on file    Relationship status: Not on file  . Intimate partner violence:    Fear of current or ex partner: Not on file    Emotionally abused: Not on file    Physically abused: Not on file    Forced sexual activity: Not on file  Other Topics Concern  . Not on file  Social History Narrative   Lives at home with daughter and son-in-law.   Right-handed.   1 cup caffeine  daily.     PHYSICAL EXAM   Vitals:   02/26/18 1240  BP: (!) 156/70  Pulse: 67  SpO2: 97%  Weight: 162 lb (73.5 kg)  Height: 5\' 2"  (1.575 m)    Not recorded      Body mass index is 29.63 kg/m.  PHYSICAL EXAMNIATION:  Gen: NAD, conversant, well nourised, obese,  well groomed                     Cardiovascular: Regular rate rhythm, no peripheral edema, warm, nontender. Eyes: Conjunctivae clear without exudates or hemorrhage Neck: Supple, no carotid bruits. Pulmonary: Clear to auscultation bilaterally   NEUROLOGICAL EXAM:  MMSE - Mini Mental State Exam 02/26/2018 04/08/2017  Orientation to time 2 2  Orientation to Place 3 4  Registration 3 3  Attention/ Calculation 5 5  Recall 0 0  Language- name 2 objects 2 2  Language- repeat 1 1  Language- follow 3 step command 2 3  Language- read & follow direction 1 1  Write a sentence 1 1  Copy design 1 1  Total score 21 23   CRANIAL NERVES: CN II: Visual fields are full to confrontation. Pupils are round equal and briskly reactive to light. CN III, IV, VI: extraocular movement are normal. No ptosis. CN V: Facial sensation is intact to pinprick in all 3 divisions bilaterally. Corneal responses are intact.  CN VII: Face is symmetric with normal eye closure and smile. CN VIII: Hearing is normal to rubbing fingers CN IX, X: Palate elevates symmetrically. Phonation is normal. CN XI: Head turning and shoulder shrug are intact CN XII: Tongue is midline with normal movements and no atrophy.  MOTOR: There is no pronator drift of out-stretched arms. Muscle bulk and tone are normal. Muscle strength is normal.Left lower extremity swelling  REFLEXES: Reflexes are 2+ and symmetric at the biceps, triceps, knees, and ankles. Plantar responses are flexor.  SENSORY: Intact to light touch, pinprick, positional sensation and vibratory sensation are intact in fingers and toes.  COORDINATION: Rapid alternating movements and fine finger  movements are intact. There is no dysmetria on finger-to-nose and heel-knee-shin.    GAIT/STANCE: She needs push up to get off from seated position, rely on her walker, cautious, unsteady Romberg is absent.   DIAGNOSTIC DATA (LABS, IMAGING, TESTING) - I reviewed patient records, labs, notes, testing and imaging myself where available.   ASSESSMENT AND PLAN  Bufford Loperene Randa is a 84 y.o. female   Mild cognitive impairment  Mini-Mental Status Examination is 21/30  Continue Namenda 10 mg twice a day  Could not tolerate Aricept due to GI side effect  Gait abnormality  Encouraged her moderate exercise,  Daughter will call back if she wants to have home physical therapy  TIA in 2017 suggestive of left frontal lobe involvement  Continue aspirin 81 mg daily   Levert FeinsteinYijun Beck Cofer, M.D. Ph.D.  [facility] [address], [address] [place], [place] [address] Ph: [phone]) [phone] Fax: [phone]  CC: Talmadge Chadeter, Donielle S, NP

## 2018-03-28 ENCOUNTER — Other Ambulatory Visit (INDEPENDENT_AMBULATORY_CARE_PROVIDER_SITE_OTHER): Payer: Self-pay | Admitting: Interventional Cardiology

## 2018-03-28 MED ORDER — NITROGLYCERIN 0.4 MG SUBLINGUAL TABLET
0.40 mg | SUBLINGUAL_TABLET | SUBLINGUAL | 1 refills | Status: AC | PRN
Start: 2018-03-28 — End: ?

## 2018-04-10 ENCOUNTER — Ambulatory Visit: Payer: Medicare Other | Admitting: Neurology

## 2018-04-10 ENCOUNTER — Encounter

## 2018-04-18 ENCOUNTER — Ambulatory Visit: Payer: Medicare Other | Admitting: Podiatry

## 2018-04-21 ENCOUNTER — Other Ambulatory Visit (HOSPITAL_COMMUNITY): Payer: Self-pay | Admitting: Family

## 2018-05-12 ENCOUNTER — Other Ambulatory Visit: Payer: Self-pay | Admitting: Neurology

## 2018-05-15 ENCOUNTER — Ambulatory Visit (INDEPENDENT_AMBULATORY_CARE_PROVIDER_SITE_OTHER): Payer: Medicare Other | Admitting: Podiatry

## 2018-05-15 DIAGNOSIS — M79675 Pain in left toe(s): Secondary | ICD-10-CM

## 2018-05-15 DIAGNOSIS — B351 Tinea unguium: Secondary | ICD-10-CM

## 2018-05-15 DIAGNOSIS — B353 Tinea pedis: Secondary | ICD-10-CM

## 2018-05-15 DIAGNOSIS — M79674 Pain in right toe(s): Secondary | ICD-10-CM

## 2018-05-15 MED ORDER — CLOTRIMAZOLE 1 % EX CREA: TOPICAL_CREAM | CUTANEOUS | 1 refills | Status: DC

## 2018-05-15 NOTE — Patient Instructions (Addendum)
Athlete's Foot Athlete's foot (tinea pedis) is a fungal infection of the skin on the feet. It often occurs on the skin that is between or underneath the toes. It can also occur on the soles of the feet. The infection can spread from person to person (is contagious). What are the causes? Athlete's foot is caused by a fungus. This fungus grows in warm, moist places. Most people get athlete's foot by sharing shower stalls, towels, and wet floors with someone who is infected. Not washing your feet or changing your socks often enough can contribute to athlete's foot. What increases the risk? This condition is more likely to develop in:  Men.  People who have a weak body defense system (immune system).  People who have diabetes.  People who use public showers, such as at a gym.  People who wear heavy-duty shoes, such as industrial or military shoes.  Seasons with warm, humid weather.  What are the signs or symptoms? Symptoms of this condition include:  Itchy areas between the toes or on the soles of the feet.  White, flaky, or scaly areas between the toes or on the soles of the feet.  Very itchy small blisters between the toes or on the soles of the feet.  Small cuts on the skin. These cuts can become infected.  Thick or discolored toenails.  How is this diagnosed? This condition is diagnosed with a medical history and physical exam. Your health care provider may also take a skin or toenail sample to be examined. How is this treated? Treatment for this condition includes antifungal medicines. These may be applied as powders, ointments, or creams. In severe cases, an oral antifungal medicine may be given. Follow these instructions at home:  Apply or take over-the-counter and prescription medicines only as told by your health care provider.  Keep all follow-up visits as told by your health care provider. This is important.  Do not scratch your feet.  Keep your feet dry: ? Wear  cotton or wool socks. Change your socks every day or if they become wet. ? Wear shoes that allow air to circulate, such as sandals or canvas tennis shoes.  Wash and dry your feet: ? Every day or as told by your health care provider. ? After exercising. ? Including the area between your toes.  Do not share towels, nail clippers, or other personal items that touch your feet with others.  If you have diabetes, keep your blood sugar under control. How is this prevented?  Do not share towels.  Wear sandals in wet areas, such as locker rooms and shared showers.  Keep your feet dry: ? Wear cotton or wool socks. Change your socks every day or if they become wet. ? Wear shoes that allow air to circulate, such as sandals or canvas tennis shoes.  Wash and dry your feet after exercising. Pay attention to the area between your toes. Contact a health care provider if:  You have a fever.  You have swelling, soreness, warmth, or redness in your foot.  You are not getting better with treatment.  Your symptoms get worse.  You have new symptoms. This information is not intended to replace advice given to you by your health care provider. Make sure you discuss any questions you have with your health care provider. Document Released: 06/22/2000 Document Revised: 12/01/2015 Document Reviewed: 12/27/2014 Elsevier Interactive Patient Education  2018 Elsevier Inc.  

## 2018-06-01 NOTE — Progress Notes (Signed)
Subjective: Patient presents to clinic with painful, elongated toenails 1-5 b/l that become tender and cannot cut because of thickness.  Objective: Physical Examination: Neurovascular status intact with thick, discolored brittle toenails 1-5 b/l  Diffuse scaling noted peripherally and plantarly b/l feet with mild foot odor.  No interdigital macerations.  No blisters, no weeping. No signs of secondary bacterial infection noted.  Assessment: Mycotic nail infection with pain 1-5 b/l Tinea pedis b/l  Plan:  Debride painful toenails 1-5 b/l with no iatrogenic bleeding. Follow up when pain symptoms return.

## 2018-06-02 ENCOUNTER — Encounter: Payer: Self-pay | Admitting: Podiatry

## 2018-06-02 DIAGNOSIS — M1990 Unspecified osteoarthritis, unspecified site: Secondary | ICD-10-CM | POA: Insufficient documentation

## 2018-06-02 DIAGNOSIS — K635 Polyp of colon: Secondary | ICD-10-CM | POA: Insufficient documentation

## 2018-06-17 ENCOUNTER — Encounter: Payer: Medicare Other | Attending: Family Medicine | Admitting: *Deleted

## 2018-06-17 DIAGNOSIS — E119 Type 2 diabetes mellitus without complications: Secondary | ICD-10-CM

## 2018-06-17 DIAGNOSIS — E1165 Type 2 diabetes mellitus with hyperglycemia: Secondary | ICD-10-CM | POA: Insufficient documentation

## 2018-06-17 NOTE — Patient Instructions (Signed)
Plan:  Aim for 2-3 Carb Choices per meal (30-45 grams) Aim for 0-1 Carbs per snack if hungry  Include protein in moderation with your meals and snacks Consider reading food labels for Total Carbohydrate of foods Consider  increasing your activity level by doing armchair exercises daily as tolerated Continue checking BG at alternate times per day as directed by MD  Continue taking medication as directed by MD

## 2018-06-17 NOTE — Progress Notes (Signed)
Diabetes Self-Management Education  Visit Type: First/Initial  Appt. Start Time: 1530 Appt. End Time: 1700  06/17/2018  Ms. Bufford LopeIrene Rice, identified by name and date of birth, is a 84 y.o. female with a diagnosis of Diabetes: Type 2. Patient is 84 years old and has some memory issues so I spoke with her daughter who is her primary caregiver too. 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 Diet history indicates very good variety of all food groups and limited refined sugar intake. Her daughter states she has managed her BG historically very well with a spike A1c of 11% last month. They also recall a recent UTI so I mentioned that may be why it went up so much so quickly. She is on 500 mg Metformin twice daily now, and they report her most recent blood sugars have been under 200 mg/dl now.   ASSESSMENT  There were no vitals taken for this visit. There is no height or weight on file to calculate BMI.  Diabetes Self-Management Education - 06/17/18 1558      Dietary Intake   Breakfast  eggs and whole wheat toast OR shredded wheat with milk, 1/2 banana, and yogurt (Activia)    Lunch  1/2 sandwich with lean meat on whole wheat bread, low salt chips, fresh fruit or fruit cup or sugar free jello    Snack (afternoon)  nuts, fresh fruit    Dinner  lean meat, starch, (potato or brown Rice), vegetable, occasionally whole wheat roll    Snack (evening)  chocolate ice cream (15 grams) OR occasionally a cookie or two small OR Kind bar    Beverage(s)  cranberry juice (low carb) mixed with tbsp OJ, coffee with cream, water       Individualized Plan for Diabetes Self-Management Training:   Learning Objective:  Patient will have a greater understanding of diabetes self-management. Patient education plan is to attend individual and/or group sessions per assessed needs and concerns.   Plan:   Patient Instructions  Plan:  Aim for 2-3 Carb Choices per meal (30-45 grams) Aim for 0-1 Carbs per  snack if hungry  Include protein in moderation with your meals and snacks Consider reading food labels for Total Carbohydrate of foods Consider  increasing your activity level by doing armchair exercises daily as tolerated Continue checking BG at alternate times per day as directed by MD  Continue taking medication as directed by MD  Expected Outcomes:  Demonstrated interest in learning. Expect positive outcomes  Education material provided: Food label handouts, A1C conversion sheet, Meal plan card and Carbohydrate counting sheet  If problems or questions, patient to contact team via:  Phone  Future DSME appointment: PRN

## 2018-06-25 ENCOUNTER — Encounter (INDEPENDENT_AMBULATORY_CARE_PROVIDER_SITE_OTHER): Payer: Self-pay | Admitting: Family Medicine

## 2018-06-25 ENCOUNTER — Ambulatory Visit (INDEPENDENT_AMBULATORY_CARE_PROVIDER_SITE_OTHER): Payer: Medicare Other | Admitting: Family Medicine

## 2018-06-25 DIAGNOSIS — M79604 Pain in right leg: Secondary | ICD-10-CM | POA: Diagnosis not present

## 2018-06-25 DIAGNOSIS — M79605 Pain in left leg: Secondary | ICD-10-CM

## 2018-06-25 NOTE — Progress Notes (Signed)
Office Visit Note   Patient: Crystal Rice           Date of Birth: 12/15/1918           MRN: [id number] Visit Date: 06/25/2018 Requested by: Talmadge Chadeter, Donielle S, NP [address] [place], [place] [id number] PCP: Talmadge Chadeter, Donielle S, NP  Subjective: Chief Complaint  Patient presents with  . Right Leg - Injury    Stumbled over a threshold on 06/23/18 and she fell "gently" with legs bent under her (2 people were trying to keep her from falling). Pain with walking that night.  Has decreased with taking Ibuprofen 200 mg 1 prn.    HPI: She is a 84 year old with bilateral shin pain.  Couple days ago at home, she stumbled and fell forward.  There were people beside her who tried to catch her, and it helped but she still but down to the ground.  She was able to get back up and walk without any severe pain.  Later that night she was a little bit sore and was limping, mainly favoring the right leg.  Over the past couple days she has gotten better but she is getting ready to go to [place] and her daughter wanted to be sure everything was okay.  She has a history of right ankle fracture ORIF a few years ago after a fall.  She did well after that and has not had any problems.  She ambulates with a walker.                ROS: Other systems were reviewed and are negative.  Objective: Vital Signs: There were no vitals taken for this visit.  Physical Exam:  Right leg: 1+ effusion in the right knee with no warmth or erythema.  There is a small bruise on the medial femoral condyle area.  Ligaments feel stable, full flexion and extension.  No significant joint line tenderness.  No tenderness to systematic palpation of the tibia or fibula on the right.  No pain with ankle dorsiflexion, eversion, or plantar flexion.   Left leg: No significant bruising, no joint effusion in the knee, full range of motion of the ankle and foot with no pain on resisted strength testing.  No significant tenderness  of the tibia or fibula.  Imaging: None today.  Assessment & Plan: 1.  Bilateral leg contusion, clinically improving. -Reassurance, activity as tolerated.  If still having pain when she returns from her trip, she will come back and we will obtain x-rays.   Follow-Up Instructions: No follow-ups on file.      Procedures: No procedures performed  No notes on file    PMFS History: Patient Active Problem List   Diagnosis Date Noted  . Arthritis 06/02/2018  . Colon polyp 06/02/2018  . Memory loss 04/08/2017  . Gait abnormality 04/08/2017  . TIA (transient ischemic attack) 04/08/2017  . Chronic combined systolic and diastolic CHF, NYHA class 2 (HCC) 11/10/2015  . Coronary artery disease involving native coronary artery of native heart without angina pectoris 11/10/2015   Past Medical History:  Diagnosis Date  . Coronary arteriosclerosis   . Diabetes mellitus without complication (HCC)   . Fatigue   . Gall stones   . Glaucoma   . Hearing loss   . Heart attack (HCC) 2012  . Hyperlipidemia   . Hypertension   . Osteoarthritis     Family History  Problem Relation Age of Onset  . Stroke Mother   .  Hypertension Mother   . Heart disease Mother   . Other Father        "old age"  . Diabetes Father   . Heart disease Father     Past Surgical History:  Procedure Laterality Date  . ABDOMINAL HYSTERECTOMY    . ANGIOPLASTY    . ANKLE SURGERY Right   . carpal tunnel release Right   . CATARACT EXTRACTION, BILATERAL    . COLONOSCOPY    . SMALL INTESTINE SURGERY    . TONSILLECTOMY    . VAGINAL PROLAPSE REPAIR     Social History   Occupational History  . Occupation: Retired  Tobacco Use  . Smoking status: Never Smoker  . Smokeless tobacco: Never Used  Substance and Sexual Activity  . Alcohol use: No  . Drug use: No  . Sexual activity: Not on file

## 2018-08-14 ENCOUNTER — Ambulatory Visit: Payer: Medicare Other | Admitting: Podiatry

## 2018-08-26 ENCOUNTER — Emergency Department (HOSPITAL_COMMUNITY)
Admission: EM | Admit: 2018-08-26 | Discharge: 2018-08-26 | Disposition: A | Discharge status: Home / Self Care | Payer: Medicare Other | Attending: Emergency Medicine | Admitting: Emergency Medicine

## 2018-08-26 ENCOUNTER — Encounter (HOSPITAL_COMMUNITY): Payer: Self-pay | Admitting: Emergency Medicine

## 2018-08-26 ENCOUNTER — Ambulatory Visit: Payer: Medicare Other | Admitting: Podiatry

## 2018-08-26 ENCOUNTER — Emergency Department (HOSPITAL_COMMUNITY): Payer: Medicare Other

## 2018-08-26 DIAGNOSIS — W228XXA Striking against or struck by other objects, initial encounter: Secondary | ICD-10-CM | POA: Insufficient documentation

## 2018-08-26 DIAGNOSIS — I5042 Chronic combined systolic (congestive) and diastolic (congestive) heart failure: Secondary | ICD-10-CM | POA: Diagnosis not present

## 2018-08-26 DIAGNOSIS — Z23 Encounter for immunization: Secondary | ICD-10-CM | POA: Insufficient documentation

## 2018-08-26 DIAGNOSIS — Y999 Unspecified external cause status: Secondary | ICD-10-CM | POA: Insufficient documentation

## 2018-08-26 DIAGNOSIS — Z79899 Other long term (current) drug therapy: Secondary | ICD-10-CM | POA: Diagnosis not present

## 2018-08-26 DIAGNOSIS — Z955 Presence of coronary angioplasty implant and graft: Secondary | ICD-10-CM | POA: Insufficient documentation

## 2018-08-26 DIAGNOSIS — Y92008 Other place in unspecified non-institutional (private) residence as the place of occurrence of the external cause: Secondary | ICD-10-CM | POA: Diagnosis not present

## 2018-08-26 DIAGNOSIS — E119 Type 2 diabetes mellitus without complications: Secondary | ICD-10-CM | POA: Insufficient documentation

## 2018-08-26 DIAGNOSIS — Z7984 Long term (current) use of oral hypoglycemic drugs: Secondary | ICD-10-CM | POA: Insufficient documentation

## 2018-08-26 DIAGNOSIS — I2510 Atherosclerotic heart disease of native coronary artery without angina pectoris: Secondary | ICD-10-CM | POA: Insufficient documentation

## 2018-08-26 DIAGNOSIS — Z7982 Long term (current) use of aspirin: Secondary | ICD-10-CM | POA: Diagnosis not present

## 2018-08-26 DIAGNOSIS — Y9389 Activity, other specified: Secondary | ICD-10-CM | POA: Insufficient documentation

## 2018-08-26 DIAGNOSIS — I110 Hypertensive heart disease with heart failure: Secondary | ICD-10-CM | POA: Insufficient documentation

## 2018-08-26 DIAGNOSIS — S0990XA Unspecified injury of head, initial encounter: Secondary | ICD-10-CM | POA: Diagnosis present

## 2018-08-26 DIAGNOSIS — S0101XA Laceration without foreign body of scalp, initial encounter: Secondary | ICD-10-CM

## 2018-08-26 MED ORDER — TETANUS-DIPHTH-ACELL PERTUSSIS 5-2.5-18.5 LF-MCG/0.5 IM SUSP
0.5000 mL | Freq: Once | INTRAMUSCULAR | Status: AC
  Administered 2018-08-26: 0.5 mL via INTRAMUSCULAR
  Filled 2018-08-26: qty 0.5

## 2018-08-26 MED ORDER — LIDOCAINE-EPINEPHRINE (PF) 2 %-1:200000 IJ SOLN
20.0000 mL | Freq: Once | INTRAMUSCULAR | Status: AC
  Administered 2018-08-26: 20 mL
  Filled 2018-08-26: qty 20

## 2018-08-26 NOTE — Discharge Instructions (Addendum)
Keep the wound clean and dry, you can use antibiotic ointment on the wound.  Staples need to be removed in 7 days.  Recheck for any problems listed on the head injury sheet.

## 2018-08-26 NOTE — ED Provider Notes (Signed)
[facility]-EMERGENCY DEPT Provider Note   CSN: [id number] Arrival date & time: 08/26/18  0043  Time seen 1:50 AM  History   Chief Complaint Chief Complaint  Patient presents with  . Head Laceration  . Fall    HPI Crystal Rice is a 85 y.o. female.   Level 5 caveat for age  HPI daughter states she was in the kitchen and her mother was in the living area which is an open area and she heard a loud bang and found the patient on the floor.  There was no loss of consciousness.  This happened about 12 midnight.  They states she fell on a rug.  She was trying to carry a bottle water and uses a walker.  She is not sure why she fell but thinks she may have tripped.  She denies any pain other than the pain from the cervical collar.  Daughter states that she has a cut on her right scalp and there was a lot of blood on the rug on the floor.  She believes her last tetanus shot was in 2003.  PCP Juluis RainierBarnes, Elizabeth, MD   Past Medical History:  Diagnosis Date  . Coronary arteriosclerosis   . Diabetes mellitus without complication (HCC)   . Fatigue   . Gall stones   . Glaucoma   . Hearing loss   . Heart attack (HCC) 2012  . Hyperlipidemia   . Hypertension   . Osteoarthritis     Patient Active Problem List   Diagnosis Date Noted  . Arthritis 06/02/2018  . Colon polyp 06/02/2018  . Memory loss 04/08/2017  . Gait abnormality 04/08/2017  . TIA (transient ischemic attack) 04/08/2017  . Chronic combined systolic and diastolic CHF, NYHA class 2 (HCC) 11/10/2015  . Coronary artery disease involving native coronary artery of native heart without angina pectoris 11/10/2015    Past Surgical History:  Procedure Laterality Date  . ABDOMINAL HYSTERECTOMY    . ANGIOPLASTY    . ANKLE SURGERY Right   . carpal tunnel release Right   . CATARACT EXTRACTION, BILATERAL    . COLONOSCOPY    . SMALL INTESTINE SURGERY    . TONSILLECTOMY    . VAGINAL PROLAPSE REPAIR       OB  History   No obstetric history on file.      Home Medications    Prior to Admission medications   Medication Sig Start Date End Date Taking? Authorizing Provider  acetaminophen (TYLENOL) 500 MG tablet Take 1,000 mg by mouth every 4 (four) hours as needed for mild pain.    Yes [provider]  aspirin EC 81 MG tablet Take 81 mg by mouth daily.   Yes [provider]  atenolol (TENORMIN) 50 MG tablet Take 50 mg by mouth at bedtime.   Yes [provider]  atorvastatin (LIPITOR) 40 MG tablet Take 40 mg by mouth at bedtime.    Yes [provider]  bimatoprost (LUMIGAN) 0.01 % SOLN Place 1 drop into both eyes at bedtime.   Yes [provider]  Calcium 500 MG CHEW Chew 1 tablet by mouth daily.   Yes [provider]  FIBER PO Take 2 tablets by mouth every evening.   Yes [provider]  lisinopril (PRINIVIL,ZESTRIL) 40 MG tablet Take 40 mg by mouth daily.   Yes [provider]  memantine (NAMENDA) 10 MG tablet Take 1 tablet (10 mg total) by mouth 2 (two) times daily. Patient taking differently:  Take 5 mg by mouth 2 (two) times daily.  02/26/18  Yes Levert Feinstein, MD  metFORMIN (GLUCOPHAGE-XR) 500 MG 24 hr tablet Take 500 mg by mouth 2 (two) times daily. With meals   Yes [provider]  Multiple Vitamins-Minerals (MULTIVITAMIN ADULT) TABS Take 1 tablet by mouth daily.   Yes [provider]  nitroGLYCERIN (NITROSTAT) 0.4 MG SL tablet Place 0.4 mg under the tongue every 5 (five) minutes as needed for chest pain.  03/28/18  Yes [provider]  Omega-3 Fatty Acids (FISH OIL) 1000 MG CAPS Take 1 capsule by mouth 2 (two) times daily.   Yes [provider]  timolol (BETIMOL) 0.5 % ophthalmic solution Place 1 drop into the left eye 2 (two) times daily.   Yes [provider]  clotrimazole (LOTRIMIN) 1 % cream Apply to both feet and between toes twice daily Patient not taking: Reported on 08/26/2018  05/15/18   Freddie Breech, DPM  ondansetron (ZOFRAN ODT) 8 MG disintegrating tablet Take 1 tablet (8 mg total) by mouth every 8 (eight) hours as needed for nausea or vomiting. Patient not taking: Reported on 08/26/2018 03/25/17   Jaynie Crumble, PA-C    Family History Family History  Problem Relation Age of Onset  . Stroke Mother   . Hypertension Mother   . Heart disease Mother   . Other Father        "old age"  . Diabetes Father   . Heart disease Father     Social History Social History   Tobacco Use  . Smoking status: Never Smoker  . Smokeless tobacco: Never Used  Substance Use Topics  . Alcohol use: No  . Drug use: No  Lives with daughter Uses a walker   Allergies   Aricept [donepezil] and Promethazine   Review of Systems Review of Systems  All other systems reviewed and are negative.    Physical Exam Updated Vital Signs BP (!) 149/68   Pulse 80   Temp 98 F (36.7 C) (Oral)   Resp 20   Ht 5' 6.5" (1.689 m)   SpO2 96%   BMI 25.76 kg/m   Vital signs normal    Physical Exam Vitals signs and nursing note reviewed.  Constitutional:      Appearance: Normal appearance.  HENT:     Head: Normocephalic.     Comments: Patient has a 2 cm vertically placed laceration to the right of midline of her posterior scalp.  She also has a large amount of swelling around it or hematoma.    Right Ear: External ear normal.     Left Ear: External ear normal.     Nose: Nose normal.     Mouth/Throat:     Mouth: Mucous membranes are dry.     Pharynx: No oropharyngeal exudate or posterior oropharyngeal erythema.  Eyes:     Extraocular Movements: Extraocular movements intact.     Conjunctiva/sclera: Conjunctivae normal.     Pupils: Pupils are equal, round, and reactive to light.  Neck:     Comments: Cervical collar is in place Cardiovascular:     Rate and Rhythm: Normal rate and regular rhythm.     Pulses: Normal pulses.  Pulmonary:     Effort: Pulmonary  effort is normal. No respiratory distress.  Chest:     Chest wall: No tenderness.  Abdominal:     General: Abdomen is flat. Bowel sounds are normal.     Palpations: Abdomen is soft.  Tenderness: There is no abdominal tenderness.  Musculoskeletal:        General: No tenderness, deformity or signs of injury.  Skin:    General: Skin is warm and dry.     Capillary Refill: Capillary refill takes less than 2 seconds.     Findings: No erythema.  Neurological:     General: No focal deficit present.     Mental Status: She is alert.     Cranial Nerves: No cranial nerve deficit.     Comments: Patient looks at daughter at times to get answers to questions, there seems to be some memory loss.  Psychiatric:        Mood and Affect: Mood normal.        Behavior: Behavior normal.        Thought Content: Thought content normal.      ED Treatments / Results  Labs (all labs ordered are listed, but only abnormal results are displayed) Labs Reviewed - No data to display  EKG None  Radiology Ct Head Wo Contrast  Ct Cervical Spine Wo Contrast  Result Date: 08/26/2018 CLINICAL DATA:  Head laceration after fall. Hematoma to right side of head. EXAM: CT HEAD WITHOUT CONTRAST CT CERVICAL SPINE WITHOUT CONTRAST TECHNIQUE: Multidetector CT imaging of the head and cervical spine was performed following the standard protocol without intravenous contrast. Multiplanar CT image reconstructions of the cervical spine were also generated. COMPARISON:  None. FINDINGS: CT HEAD FINDINGS Brain: Age related involutional changes of the brain with sulcal and ventricular prominence. Likely chronic microvascular ischemic disease of periventricular and subcortical white matter. Vascular: No hyperdense vessels. Moderate atherosclerosis of the carotid siphons. Skull: No skull fracture. Sinuses/Orbits: Bilateral cataract extractions. Clear paranasal sinuses and mastoids. Other: Right posterior parietal scalp laceration and  hematoma. CT CERVICAL SPINE FINDINGS Alignment: Maintained cervical lordosis. Skull base and vertebrae: Intact skull base. Atlantodental interval osteoarthritis is noted with joint space narrowing and minimal spurring. No cervical spine fracture. Soft tissues and spinal canal: No prevertebral fluid or swelling. No visible canal hematoma. Disc levels: Degenerative disc disease and uncovertebral joint osteoarthritis from C4 through C7 with mild bilateral foraminal encroachment. No significant central canal stenosis. Degenerative disc disease also noted at T2-3 and T3-4. Upper chest: Streaky interstitial lung markings at the right lung apex may reflect scarring. Faint nonspecific ground-glass opacity at the right lung apex may represent postinfectious or inflammatory change. This measures 4.1 x 9.2 cm. Other: Bilateral extracranial carotid arteriosclerosis. IMPRESSION: 1. Right posterior parietal scalp laceration and hematoma without underlying fracture. 2. Chronic small vessel ischemic disease of periventricular and subcortical white matter. No acute intracranial abnormality. 3. Cervical spondylosis without acute posttraumatic cervical spine fracture or subluxation. 4. Faint ground-glass opacity at the right lung apex measuring 4.1 x 9.2 cm may represent postinfectious or inflammatory change. Initial follow-up with CT at 6-12 months is recommended to confirm persistence. If persistent, repeat CT is recommended every 2 years until 5 years of stability has been established. This recommendation follows the consensus statement: Guidelines for Management of Incidental Pulmonary Nodules Detected on CT Images: From the Fleischner Society 2017; Radiology 2017; 284:228-243. Electronically Signed   By: Tollie Eth M.D.   On: 08/26/2018 04:03    Procedures .Marland KitchenLaceration Repair Date/Time: 08/26/2018 6:28 AM Performed by: Devoria Albe, MD Authorized by: Devoria Albe, MD   Consent:    Consent obtained:  Verbal   Consent given  by:  Patient Anesthesia (see MAR for exact dosages):    Anesthesia method:  Local infiltration   Local anesthetic:  Lidocaine 2% WITH epi Laceration details:    Location:  Scalp   Scalp location:  Mid-scalp   Length (cm):  2   Laceration depth: Through the dermis. Repair type:    Repair type:  Simple Pre-procedure details:    Preparation:  Imaging obtained to evaluate for foreign bodies Exploration:    Hemostasis achieved with:  Direct pressure   Wound extent: no foreign bodies/material noted     Contaminated: no   Treatment:    Area cleansed with:  Saline   Amount of cleaning:  Standard Skin repair:    Repair method:  Staples   Number of staples: 5. Approximation:    Approximation:  Close Post-procedure details:    Dressing:  Antibiotic ointment   Patient tolerance of procedure:  Tolerated well, no immediate complications   (including critical care time)  Medications Ordered in ED Medications  Tdap (BOOSTRIX) injection 0.5 mL (0.5 mLs Intramuscular Given 08/26/18 0212)  lidocaine-EPINEPHrine (XYLOCAINE W/EPI) 2 %-1:200000 (PF) injection 20 mL (20 mLs Infiltration Given by Other 08/26/18 0602)     Initial Impression / Assessment and Plan / ED Course  I have reviewed the triage vital signs and the nursing notes.  Pertinent labs & imaging results that were available during my care of the patient were reviewed by me and considered in my medical decision making (see chart for details).        CT was done due to her fall and her age.  Her tetanus booster was updated.  Final Clinical Impressions(s) / ED Diagnoses   Final diagnoses:  Laceration of scalp, initial encounter    ED Discharge Orders    None     Plan discharge  Devoria Albe, MD, Concha Pyo, MD 08/26/18 [phone]

## 2018-08-26 NOTE — ED Notes (Signed)
Bed: [facility]85 Expected date:  Expected time:  Means of arrival:  Comments: 85 yo F/ fall

## 2018-08-26 NOTE — ED Triage Notes (Signed)
Patient arrived via EMS from home. Lives with husband and daughter. Witnessed fall, patient got up from chair, lost balance and fell. Patient ambulates with walker. Patient has hematoma and head lac to R side of head, bleeding is controlled with bandaging, no LOC, no blood thinners. Patient not complaining of pain, C-collar applied for precautions. Patient is A&O x3, not to time.    -Vitals  -BP 185/98 -HR 80 -CBG 203

## 2018-09-04 ENCOUNTER — Other Ambulatory Visit: Payer: Self-pay | Admitting: Family Medicine

## 2018-09-04 DIAGNOSIS — R937 Abnormal findings on diagnostic imaging of other parts of musculoskeletal system: Secondary | ICD-10-CM

## 2018-09-25 ENCOUNTER — Other Ambulatory Visit: Payer: Medicare Other

## 2018-09-26 ENCOUNTER — Other Ambulatory Visit: Payer: Self-pay | Admitting: Podiatry

## 2018-09-26 DIAGNOSIS — B353 Tinea pedis: Secondary | ICD-10-CM

## 2018-10-07 ENCOUNTER — Ambulatory Visit: Payer: Medicare Other | Admitting: Podiatry

## 2019-01-16 ENCOUNTER — Ambulatory Visit (HOSPITAL_COMMUNITY): Payer: Self-pay | Admitting: Interventional Cardiology

## 2019-01-16 NOTE — Telephone Encounter (Signed)
Thompson, [place]  Vidya Bamford M, RN            Sorry, I have been out. I called and spoke to the pts daughter and lvms to schedule. Per the notes on the recall, it looks like pts daughter called back in and wants to schedule for September but Hannah's schedule is not out yet. They will call back to schedule.     Thanks,   [place]    Previous Messages      ----- Message -----   From: Farrel Conners, RN   Sent: 01/15/2019 12:15 PM EDT   To: [place] Thompson     Did this lady ever get set up for MyChart?   ----- Message -----   From: Thompson, [place]   Sent: 01/12/2019 10:24 AM EDT   To: Farrel Conners, RN     I called and spoke w/ pts daughter. I sent her link to finish setting up MyChart and will call to schedule appt this afternoon. Thanks!   ----- Message -----   From: Farrel Conners, RN   Sent: 01/12/2019  9:29 AM EDT   To: [place] Thompson     Would you please? I appreciate it. I have so much going on today.   Milon Score   ----- Message -----   From: Thompson, [place]   Sent: 01/12/2019  9:21 AM EDT   To: Farrel Conners, RN     The provider schedules aren't templated for video visits, so we have to schedule them manually. That's why you were getting the error message!     Her next available is 7/27. Would you like me to call the pt and offer an appt that day?   ----- Message -----   From: Farrel Conners, RN   Sent: 01/12/2019  8:59 AM EDT   To: [place] Thompson     Does Jarrett Soho have anything available soon? I keep getting an error message and cant see her schedule.   Milon Score   ----- Message -----   From: Olevia Perches, APRN,NP-C   Sent: 01/07/2019 12:52 PM EDT   To: Farrel Conners, RN     Can you reach out to the patient's daughter and set her up for MyChart visit with me on one of my clinic days. Whenever is convenient with them.     Jarrett Soho   ----- Message -----   From: Gilmore Laroche   Sent: 01/07/2019 12:39 PM EDT   To: Olevia Perches, APRN,NP-C          [name] called and stated they would to schedule a MyChart visit with Dr Gildardo Pounds (as suggested at last visit). She would like to schedule this sometime in July. Can you have someone in [place] schedule this there, or do you want it scheduled on an [place] day?     Thanks,   [employer]

## 2019-01-20 ENCOUNTER — Encounter: Payer: Self-pay | Admitting: Podiatry

## 2019-01-20 ENCOUNTER — Ambulatory Visit (INDEPENDENT_AMBULATORY_CARE_PROVIDER_SITE_OTHER): Payer: Medicare Other | Admitting: Podiatry

## 2019-01-20 ENCOUNTER — Other Ambulatory Visit: Payer: Self-pay

## 2019-01-20 DIAGNOSIS — B351 Tinea unguium: Secondary | ICD-10-CM | POA: Diagnosis not present

## 2019-01-20 DIAGNOSIS — M79674 Pain in right toe(s): Secondary | ICD-10-CM

## 2019-01-20 DIAGNOSIS — M79675 Pain in left toe(s): Secondary | ICD-10-CM | POA: Diagnosis not present

## 2019-01-20 NOTE — Patient Instructions (Signed)
Athlete's Foot  Athlete's foot (tinea pedis) is a fungal infection of the skin on your feet. It often occurs on the skin that is between or underneath the toes. It can also occur on the soles of your feet. The infection can spread from person to person (is contagious). It can also spread when a person's bare feet come in contact with the fungus on shower floors or on items such as shoes. What are the causes? This condition is caused by a fungus that grows in warm, moist places. You can get athlete's foot by sharing shoes, shower stalls, towels, and wet floors with someone who is infected. Not washing your feet or changing your socks often enough can also lead to athlete's foot. What increases the risk? This condition is more likely to develop in:  Men.  People who have a weak body defense system (immune system).  People who have diabetes.  People who use public showers, such as at a gym.  People who wear heavy-duty shoes, such as industrial or military shoes.  Seasons with warm, humid weather. What are the signs or symptoms? Symptoms of this condition include:  Itchy areas between your toes or on the soles of your feet.  White, flaky, or scaly areas between your toes or on the soles of your feet.  Very itchy small blisters between your toes or on the soles of your feet.  Small cuts in your skin. These cuts can become infected.  Thick or discolored toenails. How is this diagnosed? This condition may be diagnosed with a physical exam and a review of your medical history. Your health care provider may also take a skin or toenail sample to examine under a microscope. How is this treated? This condition is treated with antifungal medicines. These may be applied as powders, ointments, or creams. In severe cases, an oral antifungal medicine may be given. Follow these instructions at home: Medicines  Apply or take over-the-counter and prescription medicines only as told by your health  care provider.  Apply your antifungal medicine as told by your health care provider. Do not stop using the antifungal even if your condition improves. Foot care  Do not scratch your feet.  Keep your feet dry: ? Wear cotton or wool socks. Change your socks every day or if they become wet. ? Wear shoes that allow air to flow, such as sandals or canvas tennis shoes.  Wash and dry your feet, including the area between your toes. Also, wash and dry your feet: ? Every day or as told by your health care provider. ? After exercising. General instructions  Do not let others use towels, shoes, nail clippers, or other personal items that touch your feet.  Protect your feet by wearing sandals in wet areas, such as locker rooms and shared showers.  Keep all follow-up visits as told by your health care provider. This is important.  If you have diabetes, keep your blood sugar under control. Contact a health care provider if:  You have a fever.  You have swelling, soreness, warmth, or redness in your foot.  Your feet are not getting better with treatment.  Your symptoms get worse.  You have new symptoms. Summary  Athlete's foot (tinea pedis) is a fungal infection of the skin on your feet. It often occurs on skin that is between or underneath the toes.  This condition is caused by a fungus that grows in warm, moist places.  Symptoms include white, flaky, or scaly areas between   your toes or on the soles of your feet.  This condition is treated with antifungal medicines.  Keep your feet clean. Always dry them thoroughly. This information is not intended to replace advice given to you by your health care provider. Make sure you discuss any questions you have with your health care provider. Document Released: 06/22/2000 Document Revised: 06/20/2017 Document Reviewed: 04/15/2017 Elsevier Patient Education  2020 [employer]   Onychomycosis/Fungal Toenails  WHAT IS IT? An infection  that lies within the keratin of your nail plate that is caused by a fungus.  WHY ME? Fungal infections affect all ages, sexes, races, and creeds.  There may be many factors that predispose you to a fungal infection such as age, coexisting medical conditions such as diabetes, or an autoimmune disease; stress, medications, fatigue, genetics, etc.  Bottom line: fungus thrives in a warm, moist environment and your shoes offer such a location.  IS IT CONTAGIOUS? Theoretically, yes.  You do not want to share shoes, nail clippers or files with someone who has fungal toenails.  Walking around barefoot in the same room or sleeping in the same bed is unlikely to transfer the organism.  It is important to realize, however, that fungus can spread easily from one nail to the next on the same foot.  HOW DO WE TREAT THIS?  There are several ways to treat this condition.  Treatment may depend on many factors such as age, medications, pregnancy, liver and kidney conditions, etc.  It is best to ask your doctor which options are available to you.  1. No treatment.   Unlike many other medical concerns, you can live with this condition.  However for many people this can be a painful condition and may lead to ingrown toenails or a bacterial infection.  It is recommended that you keep the nails cut short to help reduce the amount of fungal nail. 2. Topical treatment.  These range from herbal remedies to prescription strength nail lacquers.  About 40-50% effective, topicals require twice daily application for approximately 9 to 12 months or until an entirely new nail has grown out.  The most effective topicals are medical grade medications available through physicians offices. 3. Oral antifungal medications.  With an 80-90% cure rate, the most common oral medication requires 3 to 4 months of therapy and stays in your system for a year as the new nail grows out.  Oral antifungal medications do require blood work to make sure it is  a safe drug for you.  A liver function panel will be performed prior to starting the medication and after the first month of treatment.  It is important to have the blood work performed to avoid any harmful side effects.  In general, this medication safe but blood work is required. 4. Laser Therapy.  This treatment is performed by applying a specialized laser to the affected nail plate.  This therapy is noninvasive, fast, and non-painful.  It is not covered by insurance and is therefore, out of pocket.  The results have been very good with a 80-95% cure rate.  [facility] is the only practice in the area to offer this therapy. 5. Permanent Nail Avulsion.  Removing the entire nail so that a new nail will not grow back. 

## 2019-01-25 NOTE — Progress Notes (Signed)
Subjective:  Crystal Rice presents to clinic today with cc of  painful, thick, discolored, elongated toenails 1-5 b/l that become tender and cannot cut because of thickness.  Pain is aggravated when wearing enclosed shoe gear.   Current Outpatient Medications:  .  acetaminophen (TYLENOL) 500 MG tablet, Take 1,000 mg by mouth every 4 (four) hours as needed for mild pain. , Disp: , Rfl:  .  aspirin EC 81 MG tablet, Take 81 mg by mouth daily., Disp: , Rfl:  .  atenolol (TENORMIN) 50 MG tablet, Take 50 mg by mouth at bedtime., Disp: , Rfl:  .  atorvastatin (LIPITOR) 40 MG tablet, Take 40 mg by mouth at bedtime. , Disp: , Rfl:  .  bimatoprost (LUMIGAN) 0.01 % SOLN, Place 1 drop into both eyes at bedtime., Disp: , Rfl:  .  Calcium 500 MG CHEW, Chew 1 tablet by mouth daily., Disp: , Rfl:  .  clotrimazole (LOTRIMIN) 1 % cream, APPLY TO BOTH FEET AND BETWEEN TOES TWICE DAILY, Disp: 30 g, Rfl: 1 .  FIBER PO, Take 2 tablets by mouth every evening., Disp: , Rfl:  .  lisinopril (PRINIVIL,ZESTRIL) 40 MG tablet, Take 40 mg by mouth daily., Disp: , Rfl:  .  memantine (NAMENDA) 10 MG tablet, Take 1 tablet (10 mg total) by mouth 2 (two) times daily. (Patient taking differently: Take 5 mg by mouth 2 (two) times daily. ), Disp: 180 tablet, Rfl: 4 .  metFORMIN (GLUCOPHAGE-XR) 500 MG 24 hr tablet, Take 500 mg by mouth 2 (two) times daily. With meals, Disp: , Rfl:  .  Multiple Vitamins-Minerals (MULTIVITAMIN ADULT) TABS, Take 1 tablet by mouth daily., Disp: , Rfl:  .  nitroGLYCERIN (NITROSTAT) 0.4 MG SL tablet, Place 0.4 mg under the tongue every 5 (five) minutes as needed for chest pain. , Disp: , Rfl: 1 .  Omega-3 Fatty Acids (FISH OIL) 1000 MG CAPS, Take 1 capsule by mouth 2 (two) times daily., Disp: , Rfl:  .  ondansetron (ZOFRAN ODT) 8 MG disintegrating tablet, Take 1 tablet (8 mg total) by mouth every 8 (eight) hours as needed for nausea or vomiting. (Patient not taking: Reported on 08/26/2018), Disp: 10 tablet,  Rfl: 0 .  timolol (BETIMOL) 0.5 % ophthalmic solution, Place 1 drop into the left eye 2 (two) times daily., Disp: , Rfl:    Allergies  Allergen Reactions  . Aricept [Donepezil]     Stomach upset, diarrhea  . Promethazine     Makes her "crazy"     Objective: Physical Examination:  Vascular Examination: Capillary refill time <3 seconds x 10 digits.  Palpable DP/PT pulses b/l.  Digital hair sparse b/l.  No edema noted b/l.  Skin temperature gradient WNL b/l.  Dermatological Examination: Skin with normal turgor, texture and tone b/l.  No open wounds b/l.  No interdigital macerations noted b/l.  Elongated, thick, discolored brittle toenails with subungual debris and pain on dorsal palpation of nailbeds 1-5 b/l.  Musculoskeletal Examination: Muscle strength 5/5 to all muscle groups b/l  Neurological Examination: Sensation intact 5/5 b/l with 10 gram monofilament.  Vibratory sensation intact b/l.  Assessment: Mycotic nail infection with pain 1-5 b/l  Plan: 1. Toenails 1-5 b/l were debrided in length and girth without iatrogenic laceration. 2.  Continue soft, supportive shoe gear daily. 3.  Report any pedal injuries to medical professional. 4.  Follow up 3 months. 5.  Patient/POA to call should there be a question/concern in there interim.

## 2019-01-27 ENCOUNTER — Other Ambulatory Visit: Payer: Self-pay | Admitting: Podiatry

## 2019-01-27 DIAGNOSIS — B353 Tinea pedis: Secondary | ICD-10-CM

## 2019-01-28 ENCOUNTER — Other Ambulatory Visit: Payer: Self-pay

## 2019-02-02 ENCOUNTER — Ambulatory Visit: Payer: Medicare Other | Admitting: Podiatry

## 2019-02-05 ENCOUNTER — Other Ambulatory Visit: Payer: Self-pay

## 2019-02-05 DIAGNOSIS — B353 Tinea pedis: Secondary | ICD-10-CM

## 2019-02-05 MED ORDER — CLOTRIMAZOLE 1 % EX CREA: TOPICAL_CREAM | CUTANEOUS | 1 refills | Status: AC

## 2019-02-18 ENCOUNTER — Ambulatory Visit (INDEPENDENT_AMBULATORY_CARE_PROVIDER_SITE_OTHER): Payer: Medicare Other | Admitting: Interventional Cardiology

## 2019-02-18 ENCOUNTER — Encounter (INDEPENDENT_AMBULATORY_CARE_PROVIDER_SITE_OTHER): Payer: Self-pay | Admitting: Interventional Cardiology

## 2019-02-18 ENCOUNTER — Other Ambulatory Visit: Payer: Self-pay

## 2019-02-18 VITALS — BP 103/66 | HR 65 | Resp 18 | Ht 62.0 in | Wt 141.0 lb

## 2019-02-18 DIAGNOSIS — I2510 Atherosclerotic heart disease of native coronary artery without angina pectoris: Secondary | ICD-10-CM

## 2019-02-18 DIAGNOSIS — E785 Hyperlipidemia, unspecified: Secondary | ICD-10-CM

## 2019-02-18 DIAGNOSIS — I5032 Chronic diastolic (congestive) heart failure: Secondary | ICD-10-CM

## 2019-02-18 DIAGNOSIS — I10 Essential (primary) hypertension: Secondary | ICD-10-CM

## 2019-02-18 NOTE — Nursing Note (Signed)
Meds checked with patients list and confirmed correct pharmacy with patient for e-scribing.   Excell Seltzer, MA  02/18/2019, 13:53

## 2019-03-02 IMAGING — CT CT CERVICAL SPINE W/O CM
4 of 7 series · 13 of 33 positions shown, 14 images · non-contrast
Comparison: None.

CLINICAL DATA: Head laceration after fall. Hematoma to right side
of head.

EXAM:
CT HEAD WITHOUT CONTRAST
CT CERVICAL SPINE WITHOUT CONTRAST
TECHNIQUE: Multidetector CT imaging of the head and cervical spine was
performed following the standard protocol without intravenous
contrast. Multiplanar CT image reconstructions of the cervical spine
were also generated.

[Series 8: c spine soft · axial · 0.33mm/px · z∈[+1357,+1471]mm · 4 of 95 slices shown]
[im 19/95  soft-tissue]
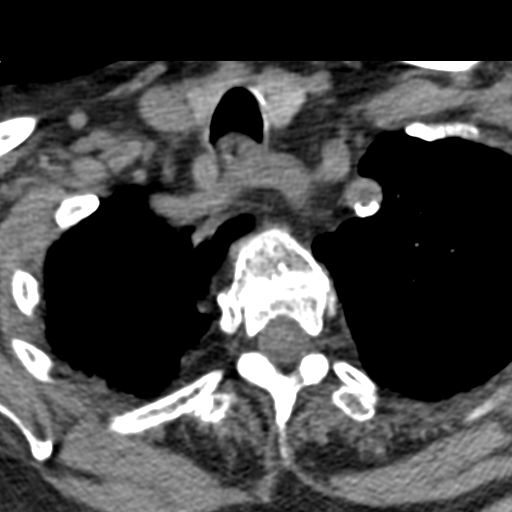
[im 38/95  soft-tissue]
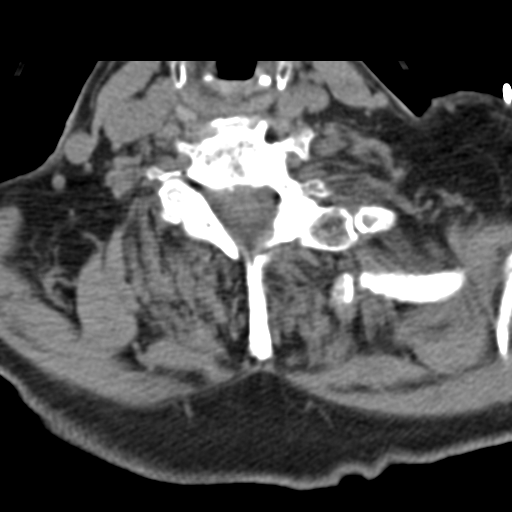
[im 57/95  soft-tissue]
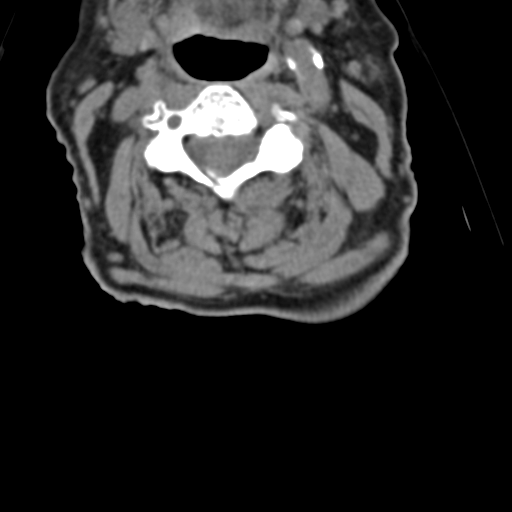
[im 76/95  soft-tissue]
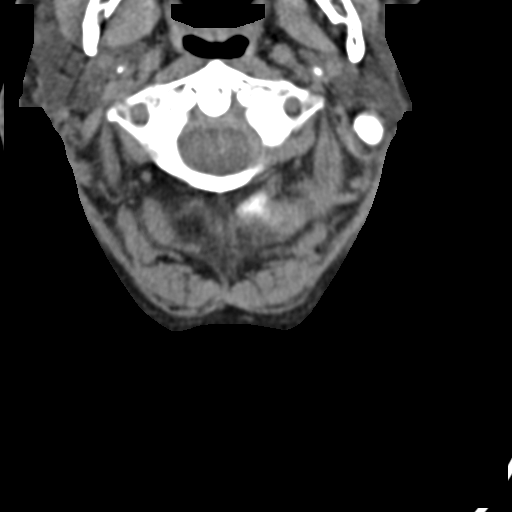

[Series 9: orthogonal bone · axial · 0.22mm/px · z∈[+1339,+1446]mm · 4 of 97 slices shown, 5 images]
[im 20/97  soft-tissue]
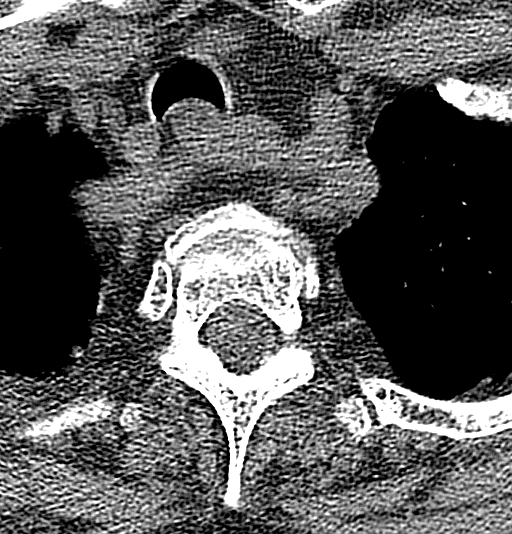
[im 20/97  bone]
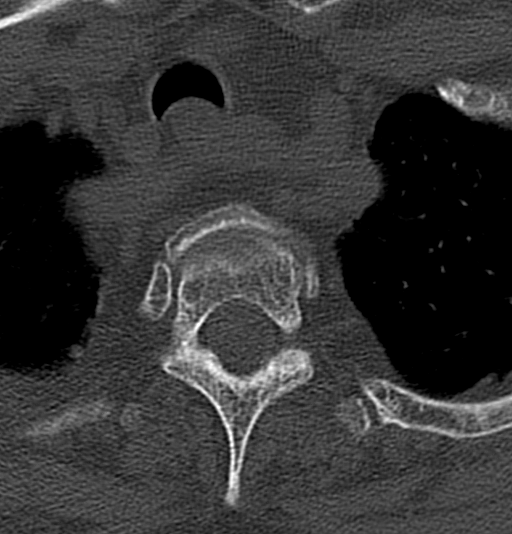
[im 39/97  bone]
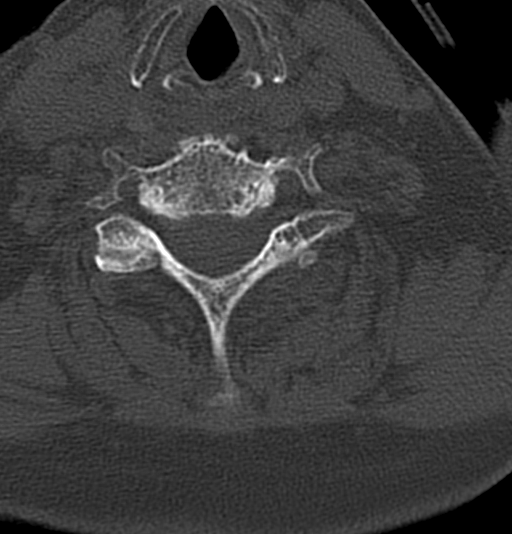
[im 58/97  bone]
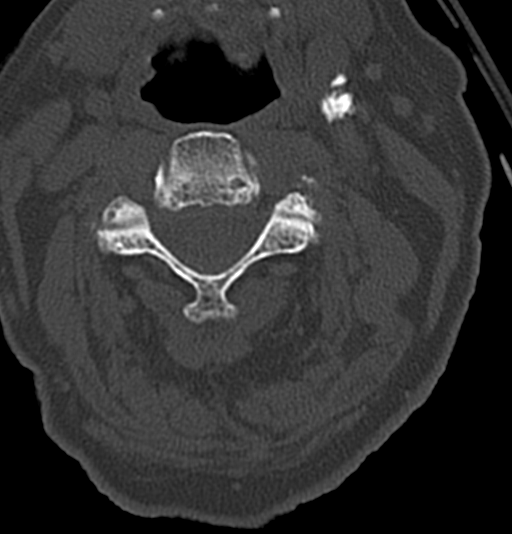
[im 77/97  bone]
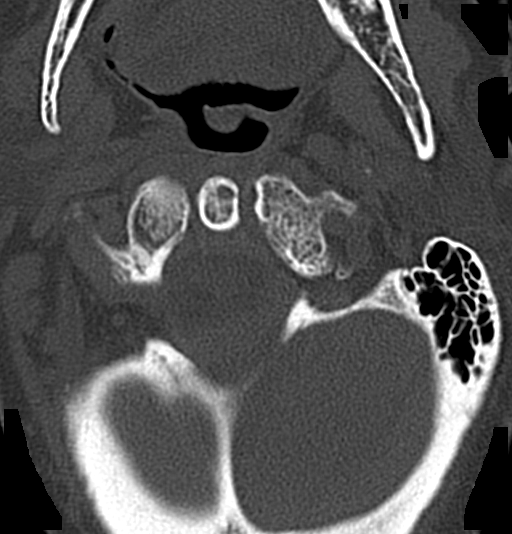

[Series 10: coronal bone · coronal · 0.23mm/px · 1 of 53 slices shown]
[im 27/53  bone]
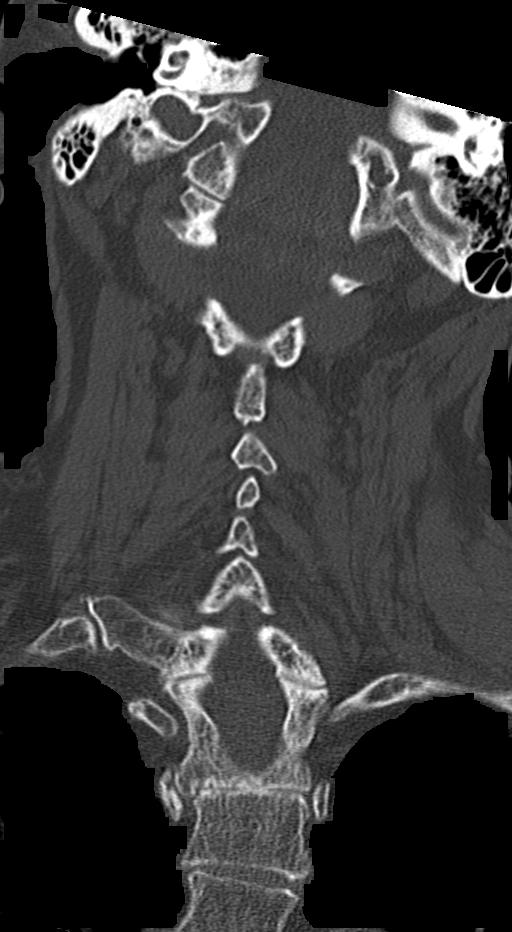

[Series 11: sagittal bone · sagittal · 0.23mm/px · 4 of 44 slices shown]
[im 9/44  bone]
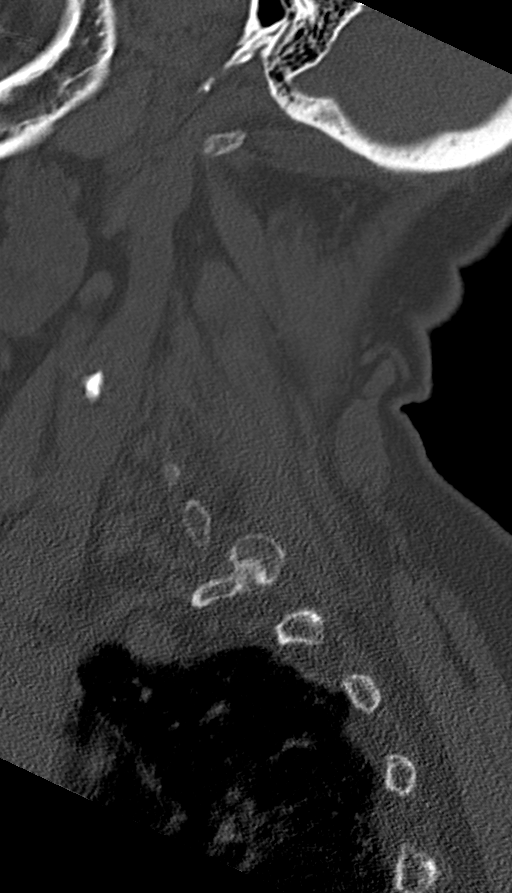
[im 18/44  bone]
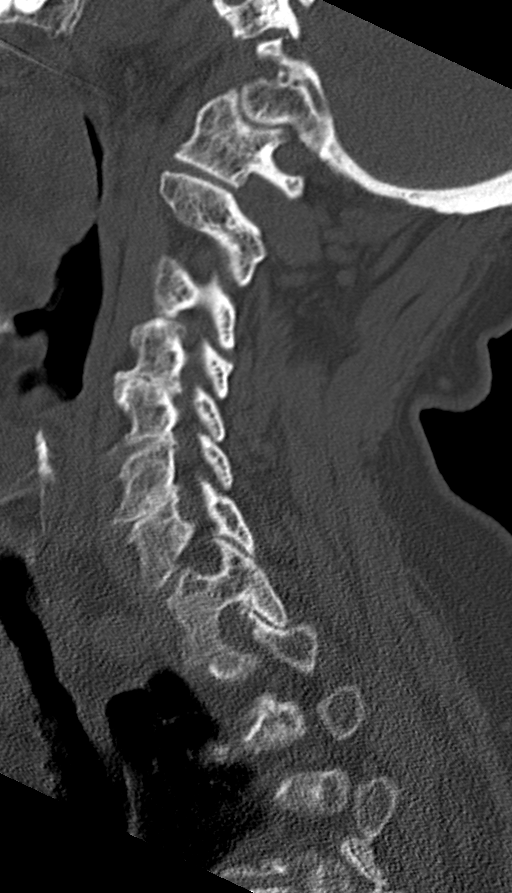
[im 26/44  bone]
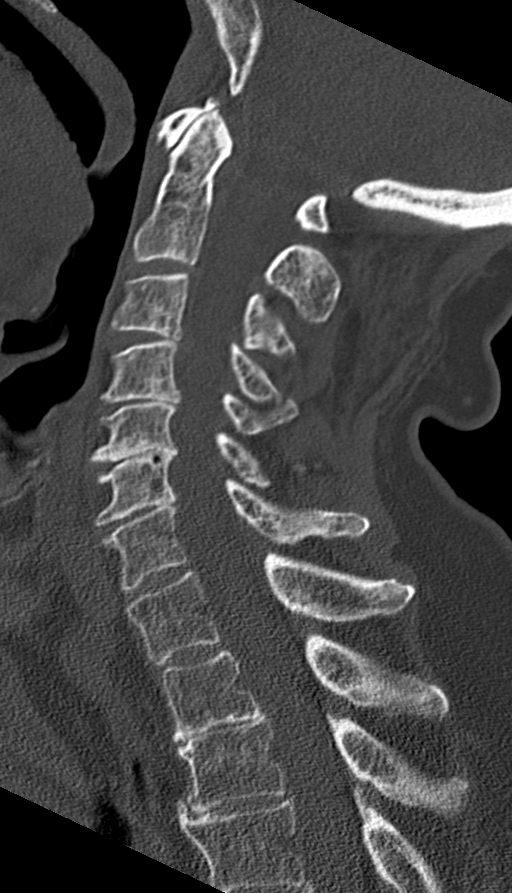
[im 35/44  bone]
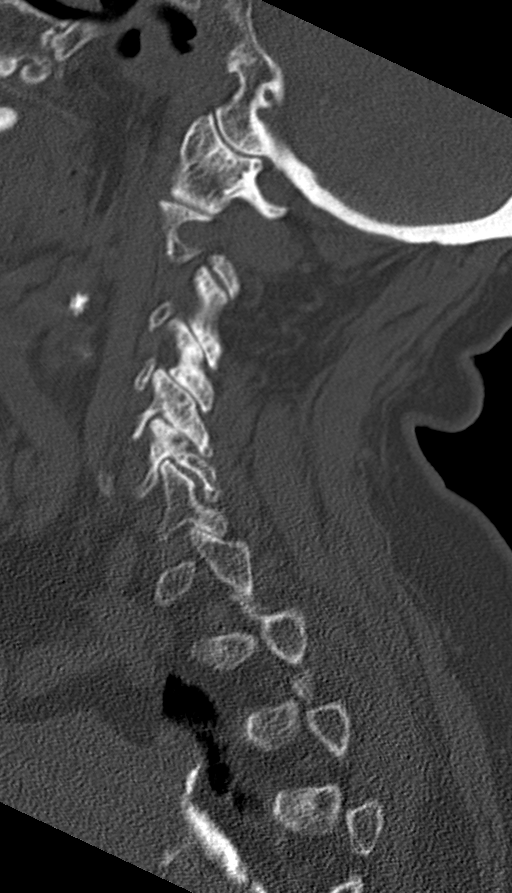

[13 of 33 positions shown; findings below may reference images not displayed]

FINDINGS: CT HEAD FINDINGS

Brain: Age related involutional changes of the brain with sulcal and
ventricular prominence. Likely chronic microvascular ischemic
disease of periventricular and subcortical white matter.

Vascular: No hyperdense vessels. Moderate atherosclerosis of the
carotid siphons.

Skull: No skull fracture.

Sinuses/Orbits: Bilateral cataract extractions. Clear paranasal
sinuses and mastoids.

Other: Right posterior parietal scalp laceration and hematoma.

CT CERVICAL SPINE FINDINGS

Alignment: Maintained cervical lordosis.

Skull base and vertebrae: Intact skull base. Atlantodental interval
osteoarthritis is noted with joint space narrowing and minimal
spurring. No cervical spine fracture.

Soft tissues and spinal canal: No prevertebral fluid or swelling. No
visible canal hematoma.

Disc levels: Degenerative disc disease and uncovertebral joint
osteoarthritis from C4 through C7 with mild bilateral foraminal
encroachment. No significant central canal stenosis. Degenerative
disc disease also noted at T2-3 and T3-4.

Upper chest: Streaky interstitial lung markings at the right lung
apex may reflect scarring. Faint nonspecific ground-glass opacity at
the right lung apex may represent postinfectious or inflammatory
change. This measures 4.1 x 9.2 cm.

Other: Bilateral extracranial carotid arteriosclerosis.
IMPRESSION: 1. Right posterior parietal scalp laceration and hematoma without
underlying fracture.
2. Chronic small vessel ischemic disease of periventricular and
subcortical white matter. No acute intracranial abnormality.
3. Cervical spondylosis without acute posttraumatic cervical spine
fracture or subluxation.
4. Faint ground-glass opacity at the right lung apex measuring 4.1 x
9.2 cm may represent postinfectious or inflammatory change. Initial
follow-up with CT at 6-12 months is recommended to confirm
persistence. If persistent, repeat CT is recommended every 2 years
until 5 years of stability has been established. This recommendation
follows the consensus statement: Guidelines for Management of
Incidental Pulmonary Nodules Detected on CT Images: From the

## 2019-03-31 ENCOUNTER — Other Ambulatory Visit: Payer: Self-pay | Admitting: Neurology

## 2019-04-05 NOTE — Progress Notes (Signed)
[facility]  [address]  [place]  [address]  [phone]      PATIENT NAME:  [facility] NUMBER:  [id number]  DATE OF BIRTH:  07-Aug-1918  DATE OF SERVICE:  02/18/2019      OFFICE NOTE    CARDIAC HISTORY: The patient was initially evaluated on 09/13/10 for preoperative assessment prior to a fairly low risk noncardiac surgery. A transthoracic echocardiogram was performed and [facility] shortly after that clinic visit. This echocardiogram showed a mildly reduced ejection fraction with wall motion abnormalities involving the anteroseptal wall and apex. Because the patient was fairly asymptomatic and the surgery was a low-risk procedure, it was recommended she proceed with her surgery. She did have complaints of chest pain following the surgery and was diagnosed with a non-ST segment elevation acute myocardial infarction. A transthoracic echocardiogram on 09/19/10 showed an ejection fraction of 35% with hypokinesia involving the mid to distal anteroseptal wall. The apex appears dyskinetic. There was septal dyssynergy. There is mild diastolic dysfunction mildly dilated left atrium. The right ventricular systolic pressure was mildly elevated at 45 mmHg consistent with mild pulmonary hypertension. She was started on aggressive medical therapy with resolution of her symptoms. Shortly after her hospital discharge, her symptoms returned and continued despite escalating doses of antianginal medications. The patient was brought to the cardiac catheterization laboratory on 09/29/10. The mid portion of the left anterior descending coronary artery contained a severe lesion, but the vessel tapered in size distal to the lesion. Based on the echocardiogram findings, this was felt to represent an old myocardial infarction with spontaneous recannulization of the vessel. There were multiple areas of severe disease within the right coronary system. An 80% lesion in the mid posterior  lateral branch was treated with a 3.0 x 15 mm Integrity stent. A 70% lesion in the mid right coronary artery was treated with 4.0 x 12 mm Integrity stent. This was overlapped distally with a 3.5 x 12 mm Integrity stent to treat a distal edge dissection. The proximal right coronary artery contained an 80% ulcerated plaque which was felt to be the cause of the patient's non-ST segment elevation acute myocardial infarction. This was treated with a 4.0 x 15 mm Integrity stent. Bilateral carotid artery Dopplers were performed on 04/10/11. This showed mild (less than 40%) bilateral internal carotid artery stenosis. The vertebral arteries were patent with antegrade flow.  A transthoracic echocardiogram from 05/10/17 showed an ejection fraction of 60-65%.  There was mild concentric left ventricular hypertrophy and mild diastolic dysfunction noted. The right ventricle was moderately dilated with moderately reduced systolic function.  The right ventricular systolic pressure was estimated at 48 mmHg which would be consistent with mild pulmonary hypertension.  There was mild mitral valve regurgitation and moderate to severe tricuspid valve regurgitation noted. Bilateral carotid artery duplex from 05/13/17 showed mild (less than 40 %) stenosis in the bilateral internal carotid arteries.  The vertebral and subclavian arteries were noted to be patent with antegrade flow.    SUBJECTIVE:  The patient presents to cardiology clinic today for follow-up of her coronary artery disease, chronic diastolic NYHA class 2 congestive heart failure with history of resolved ischemic cardiomyopathy, hypertension, and hyperlipidemia.  She denies any symptoms of chest pain.  She feels that her dyspnea on exertion is stable.  She has not had any difficulty with lower extremity swelling.  She denied any paroxysmal nocturnal dyspnea, orthopnea, palpitations, or syncope.  She has been tolerating her  medications without any side effects.    MEDICATIONS:      Current Outpatient Medications   Medication Sig   . acetaminophen (TYLENOL) 500 mg Oral Tablet take 500 mg by mouth QPM.     . Aspirin 81 mg Oral Tablet take  by mouth Once a day.     Marland Kitchen atenolol (TENORMIN) 50 mg Oral Tablet TAKE 1 TABLET DAILY   . atorvastatin (LIPITOR) 20 mg Oral Tablet Take 1 Tab (20 mg total) by mouth Every evening   . Bimatoprost (LUMIGAN) 0.01 % Ophthalmic Drops Instill 1 Drop into both eyes Every night   . CA CARBONATE/VITAMIN D3/VIT K (CALCIUM CHEW ORAL) take  by mouth.   Marland Kitchen lisinopril (PRINIVIL) 40 mg Oral Tablet Take 1 Tab (40 mg total) by mouth Once a day   . memantine (NAMENDA) 10 mg Oral Tablet Take 10 mg by mouth Once a day   . metFORMIN (GLUCOPHAGE) 500 mg Oral Tablet Take 500 mg by mouth Every morning with breakfast   . multivitamin Oral Tablet take 1 Tab by mouth Once a day.   . nitrofurantoin (MACROBID) 100 mg Oral Capsule Take 100 mg by mouth Twice daily   . nitroGLYCERIN (NITROSTAT) 0.4 mg Sublingual Tablet, Sublingual 1 Tab (0.4 mg total) by Sublingual route Every 5 minutes as needed for Chest pain   . OMEGA-3 FATTY ACIDS (FISH OIL ORAL) Take 1,500 mg by mouth Once a day    . ondansetron (ZOFRAN ODT) 8 mg Oral Tablet, Rapid Dissolve Take 8 mg by mouth Every 8 hours as needed   . PSYLLIUM SEED, WITH DEXTROSE, (FIBER ORAL) Take by mouth   . timolol maleate (TIMOPTIC) 0.5 % Ophthalmic Drops 1 Drop Once a day     OBJECTIVE:  Vitals:    02/18/19 1353   BP: 103/66   Pulse: 65   Resp: 18   SpO2: 93%   Weight: 64 kg (141 lb)   Height: 1.575 m (5\' 2" )   BMI: 25.84     General: The patient is alert and oriented and is in no acute distress.  HEENT: There is no evidence of JVD, carotid bruit, or thyromegaly.  Respiratory: The lungs are clear to auscultation bilaterally without any distinct crackles or wheezing.  Cardiac: The heart is regular without any evidence of murmur, rub, gallop, or S3.  Gastrointestinal: The abdomen is soft, nontender, and nondistended. There are normal active bowel  sounds present. There is no evidence of hepatosplenomegaly, mass, or bruit.  Extremities: There is no evidence of clubbing, cyanosis, or edema.    ASSESSMENT AND PLAN:  1.  Coronary artery disease.  No symptoms of angina.  Dyspnea on exertion is stable.  Continue current medications.  Return to clinic in 1 year or sooner as indicated by symptoms.  2.  Chronic diastolic NYHA class 2 congestive heart failure with history of resolved ischemic cardiomyopathy.  Dyspnea on exertion is stable.  No signs of volume overload.  Continue current medications.  3.  Hypertension.  Blood pressure well controlled.  Continue current medications.  4.  Hyperlipidemia.  No fasting lipid panel for review.  Given her advanced age I do not feel that it is necessary to monitor a fasting lipid panel. Continue fish oil and current dose of Lipitor.      , MD  Associate Professor, Section of Cardiology  [facility] Department of Medicine

## 2019-04-09 DEATH — deceased

## 2019-04-22 ENCOUNTER — Ambulatory Visit: Payer: Medicare Other | Admitting: Podiatry

## 2019-04-30 ENCOUNTER — Other Ambulatory Visit: Payer: Medicare Other

## 2019-05-18 ENCOUNTER — Ambulatory Visit: Payer: Medicare Other | Admitting: Podiatry

## 2020-03-16 ENCOUNTER — Encounter (INDEPENDENT_AMBULATORY_CARE_PROVIDER_SITE_OTHER): Payer: Self-pay | Admitting: Interventional Cardiology

## 2022-08-26 ENCOUNTER — Encounter (INDEPENDENT_AMBULATORY_CARE_PROVIDER_SITE_OTHER): Payer: Self-pay

## 2023-11-26 ENCOUNTER — Encounter (INDEPENDENT_AMBULATORY_CARE_PROVIDER_SITE_OTHER): Payer: Self-pay
